# Patient Record
Sex: Male | Born: 1960 | Race: White | Hispanic: No | Marital: Married | Smoking: Former smoker
Health system: Southern US, Community
[De-identification: ages and names within clinical notes are randomized; demographics above are authoritative.]

## PROBLEM LIST (undated history)

## (undated) DIAGNOSIS — E785 Hyperlipidemia, unspecified: Secondary | ICD-10-CM

## (undated) DIAGNOSIS — C61 Malignant neoplasm of prostate: Secondary | ICD-10-CM

## (undated) DIAGNOSIS — E119 Type 2 diabetes mellitus without complications: Secondary | ICD-10-CM

## (undated) DIAGNOSIS — Z8619 Personal history of other infectious and parasitic diseases: Secondary | ICD-10-CM

## (undated) HISTORY — PX: COLONOSCOPY: SHX174

## (undated) HISTORY — DX: Personal history of other infectious and parasitic diseases: Z86.19

## (undated) HISTORY — DX: Malignant neoplasm of prostate: C61

## (undated) HISTORY — DX: Type 2 diabetes mellitus without complications: E11.9

## (undated) HISTORY — DX: Hyperlipidemia, unspecified: E78.5

---

## 2012-06-12 LAB — HM HEPATITIS C SCREENING LAB: HM Hepatitis Screen: NEGATIVE

## 2012-06-24 ENCOUNTER — Ambulatory Visit: Payer: Self-pay | Admitting: Gastroenterology

## 2012-06-24 LAB — HM COLONOSCOPY

## 2013-07-24 ENCOUNTER — Emergency Department: Payer: Self-pay | Admitting: Emergency Medicine

## 2013-07-25 ENCOUNTER — Ambulatory Visit: Payer: Self-pay | Admitting: Family Medicine

## 2013-07-25 IMAGING — CR DG LUMBAR SPINE 2-3V
1 series · 3 of 3 positions shown · non-contrast
Comparison: None.

CLINICAL DATA: Low back pain

EXAM:
LUMBAR SPINE - 2-3 VIEW

[Series 1: ap · 0.17mm/px · 3 of 3 slices shown]
[im 1/3]
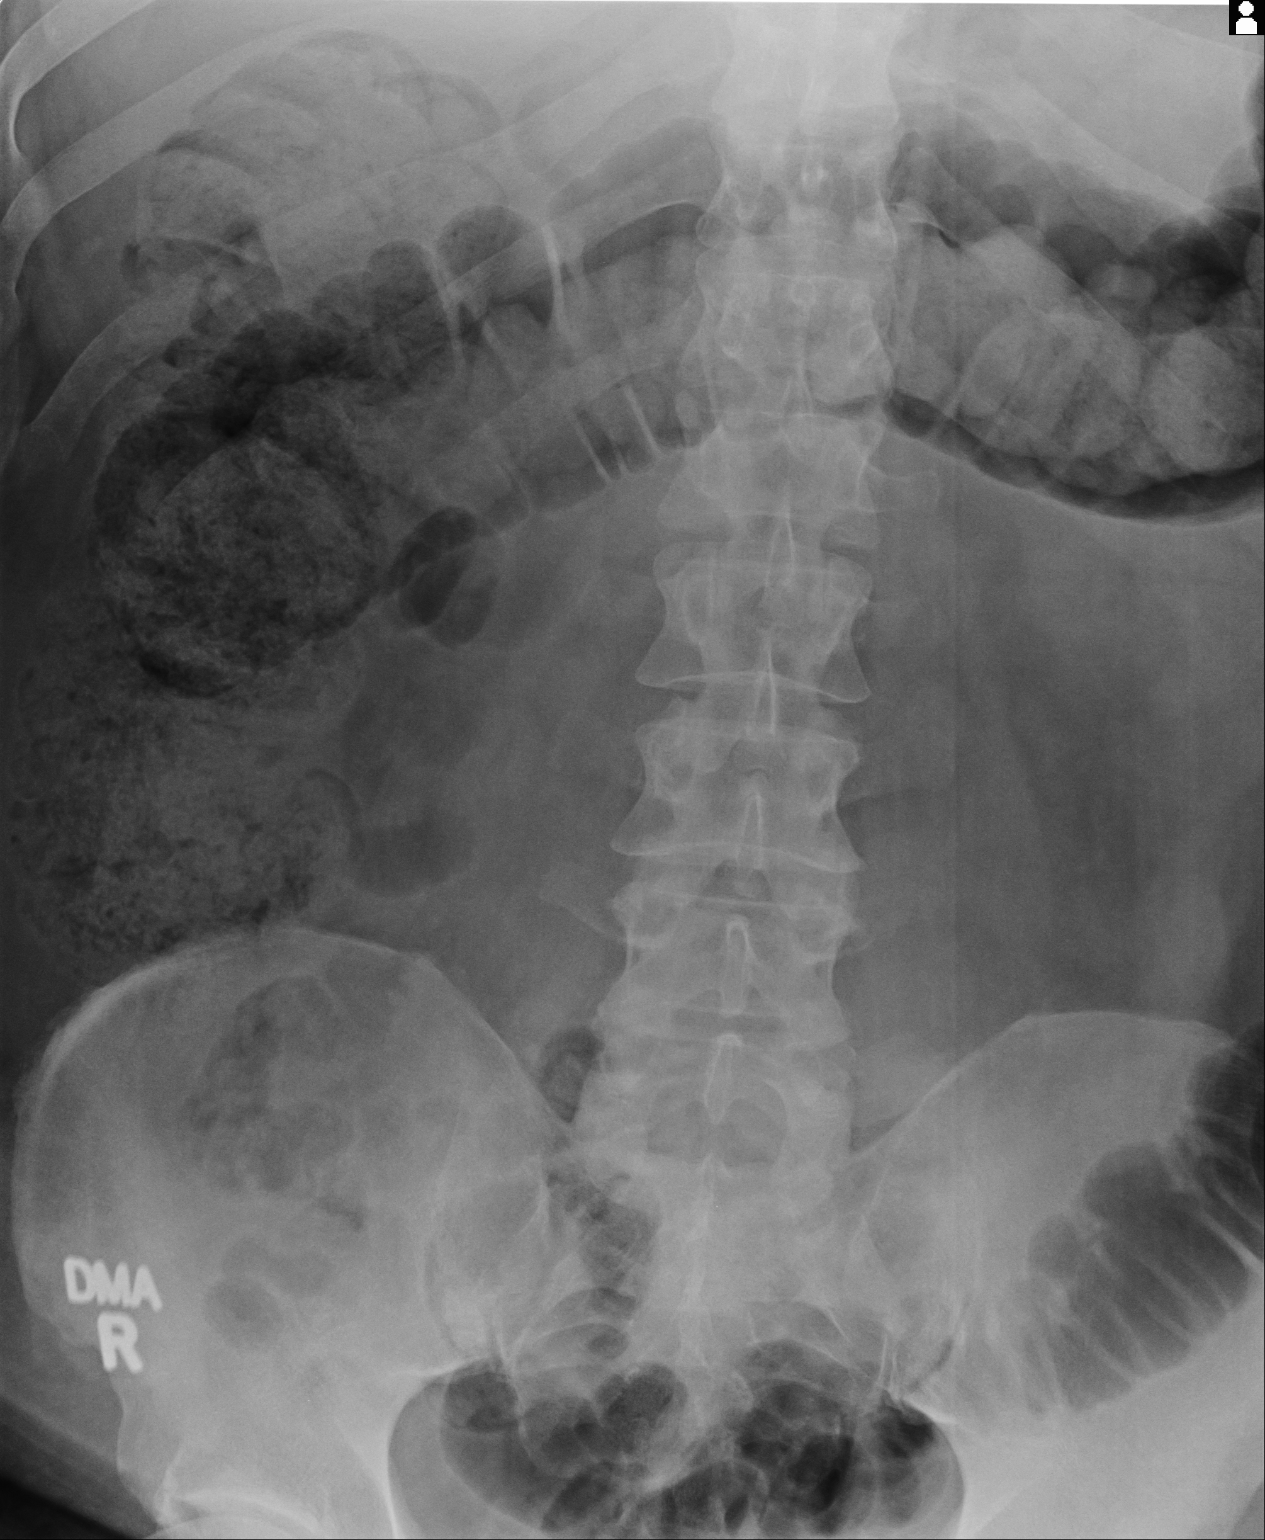
[im 2/3]
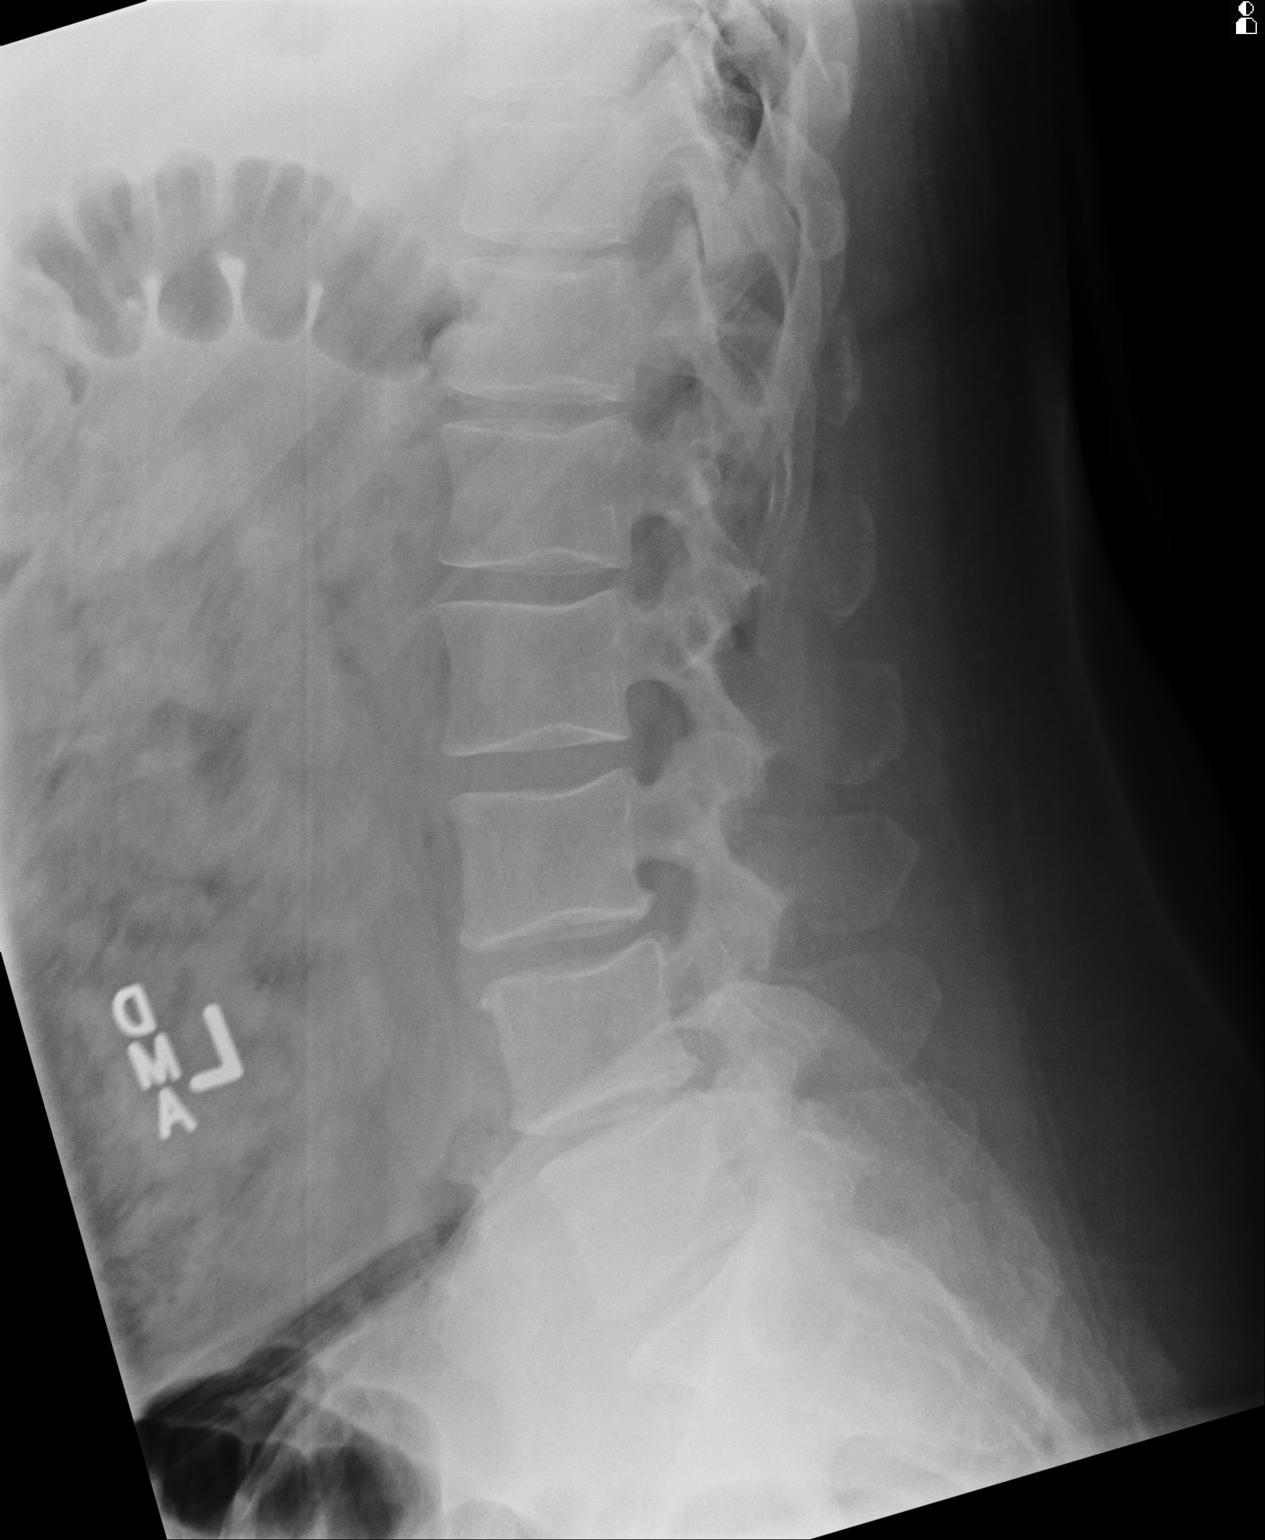
[im 3/3]
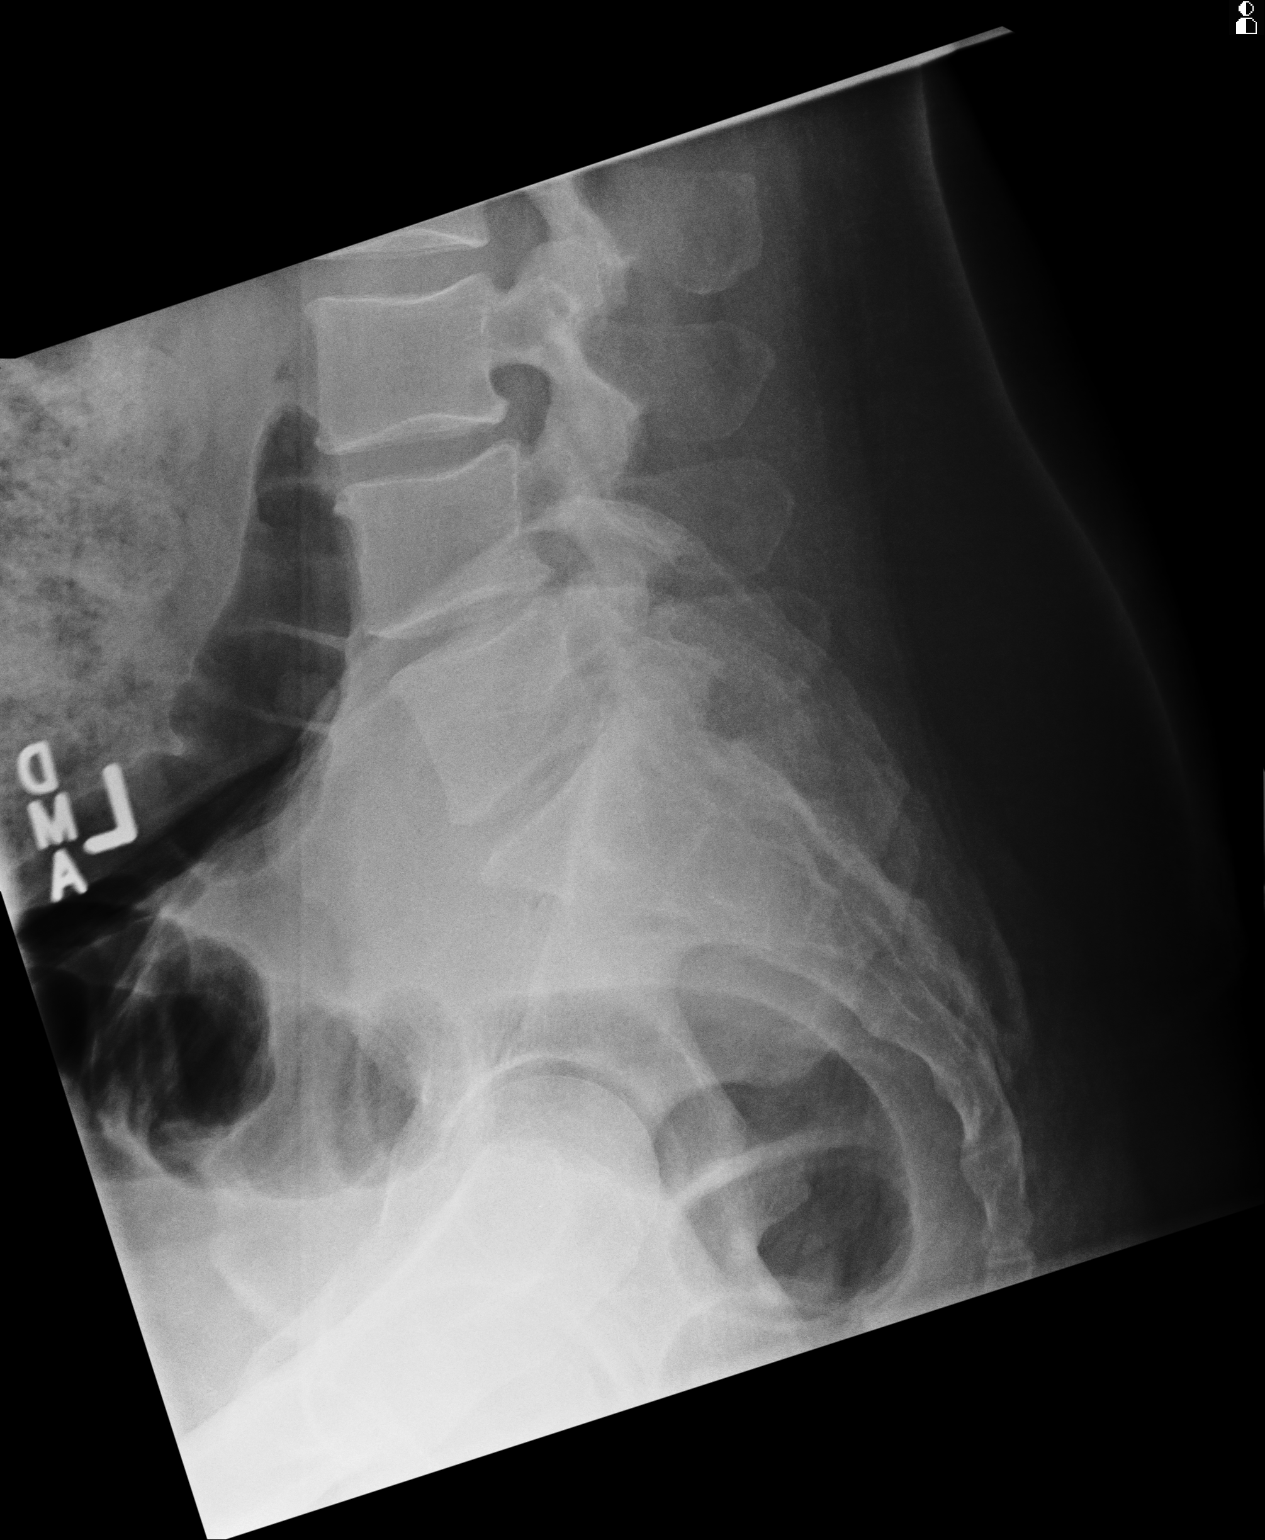

[3 of 3 positions shown; findings below may reference images not displayed]

FINDINGS: Two views study shows no fracture. No subluxation. Loss of disc
height is seen at L4-5 and L5-S1. SI joints are normal.
IMPRESSION: Degenerative disc disease in the lower lumbar spine.

## 2014-07-08 LAB — PSA: PSA: 2.1

## 2014-07-08 LAB — BASIC METABOLIC PANEL
BUN: 16 mg/dL (ref 4–21)
Creatinine: 1.1 mg/dL (ref <=1.3)
Glucose: 335 mg/dL
POTASSIUM: 4.5 mmol/L (ref 3.4–5.3)
SODIUM: 136 mmol/L — AB (ref 137–147)

## 2015-01-08 LAB — HEPATIC FUNCTION PANEL: ALT: 25 U/L (ref 10–40)

## 2015-01-08 LAB — LIPID PANEL
Cholesterol: 150 mg/dL (ref 0–200)
HDL: 44 mg/dL (ref 35–70)
LDL Cholesterol: 85 mg/dL
TRIGLYCERIDES: 103 mg/dL (ref 40–160)

## 2015-01-08 LAB — HEMOGLOBIN A1C: Hgb A1c MFr Bld: 7.4 % — AB (ref 4.0–6.0)

## 2015-03-28 ENCOUNTER — Other Ambulatory Visit: Payer: Self-pay | Admitting: Family Medicine

## 2015-06-07 DIAGNOSIS — M545 Low back pain, unspecified: Secondary | ICD-10-CM | POA: Insufficient documentation

## 2015-06-07 DIAGNOSIS — E785 Hyperlipidemia, unspecified: Secondary | ICD-10-CM | POA: Insufficient documentation

## 2015-06-07 DIAGNOSIS — R809 Proteinuria, unspecified: Secondary | ICD-10-CM | POA: Insufficient documentation

## 2015-06-09 ENCOUNTER — Encounter: Payer: Self-pay | Admitting: Family Medicine

## 2015-06-09 ENCOUNTER — Ambulatory Visit (INDEPENDENT_AMBULATORY_CARE_PROVIDER_SITE_OTHER): Payer: Managed Care, Other (non HMO) | Admitting: Family Medicine

## 2015-06-09 VITALS — BP 106/70 | HR 64 | Temp 98.1°F | Resp 16 | Wt 286.0 lb

## 2015-06-09 DIAGNOSIS — E1121 Type 2 diabetes mellitus with diabetic nephropathy: Secondary | ICD-10-CM | POA: Diagnosis not present

## 2015-06-09 DIAGNOSIS — E669 Obesity, unspecified: Secondary | ICD-10-CM

## 2015-06-09 DIAGNOSIS — E785 Hyperlipidemia, unspecified: Secondary | ICD-10-CM

## 2015-06-09 DIAGNOSIS — E119 Type 2 diabetes mellitus without complications: Secondary | ICD-10-CM | POA: Diagnosis not present

## 2015-06-09 LAB — POCT GLYCOSYLATED HEMOGLOBIN (HGB A1C)
Est. average glucose Bld gHb Est-mCnc: 174
Hemoglobin A1C: 7.7

## 2015-06-09 NOTE — Progress Notes (Signed)
Patient: Ronald Shah Male    DOB: Oct 05, 1960   54 y.o.   MRN: [id number] Visit Date: 06/09/2015  Today's Provider: Lelon Huh, MD   Chief Complaint  Patient presents with  . Diabetes    follow up   Subjective:    HPI  Diabetes Mellitus Type II, Follow-up:   Lab Results  Component Value Date   HGBA1C 7.4* 01/08/2015    Last seen for diabetes 5 months ago.  Management since then includes none. He reports good compliance with treatment. He is not having side effects.  Current symptoms include none and have been stable. Home blood sugar records: fasting range: 120-140  Episodes of hypoglycemia? no   Current Insulin Regimen:  none Most Recent Eye Exam:  >1 year Weight trend: stable Prior visit with dietician: no Current diet: in general, an "unhealthy" diet Current exercise: none  Pertinent Labs:    Component Value Date/Time   CHOL 150 01/08/2015   TRIG 103 01/08/2015   CREATININE 1.1 07/08/2014    Wt Readings from Last 3 Encounters:  06/09/15 286 lb (129.729 kg)  01/08/15 287 lb (130.182 kg)   BMP Latest Ref Rng 07/08/2014  BUN 4 - 21 mg/dL 16  Creatinine .6 - 1.3 mg/dL 1.1  Sodium 137 - 147 mmol/L 136(A)  Potassium 3.4 - 5.3 mmol/L 4.5     ------------------------------------------------------------------------      No Known Allergies Previous Medications   GLIPIZIDE (GLUCOTROL) 5 MG TABLET    Take 1 tablet by mouth 2 (two) times daily.   METFORMIN (GLUMETZA) 500 MG (MOD) 24 HR TABLET    Take 2 tablets by mouth 2 (two) times daily.   PRAVASTATIN (PRAVACHOL) 40 MG TABLET    TAKE 1 TABLET BY MOUTH DAILY   SITAGLIPTIN (JANUVIA) 100 MG TABLET    Take 1 tablet by mouth daily.    Review of Systems  Constitutional: Negative for fever, chills and appetite change.  Respiratory: Negative for chest tightness, shortness of breath and wheezing.   Cardiovascular: Negative for chest pain and palpitations.  Gastrointestinal: Negative for nausea,  vomiting and abdominal pain.    Social History  Substance Use Topics  . Smoking status: Current Some Day Smoker  . Smokeless tobacco: Not on file     Comment: smoked 1 ppd for 20-25 yrs quit in 2005  . Alcohol Use: Yes     Comment: Occasional   Objective:   BP 106/70 mmHg  Pulse 64  Temp(Src) 98.1 F (36.7 C) (Oral)  Resp 16  Wt 286 lb (129.729 kg)  Physical Exam General Appearance:    Alert, cooperative, no distress, obese  Eyes:    PERRL, conjunctiva/corneas clear, EOM's intact       Lungs:     Clear to auscultation bilaterally, respirations unlabored  Heart:    Regular rate and rhythm  Neurologic:   Awake, alert, oriented x 3. No apparent focal neurological           defect.       Diabetic Foot Exam - Simple   No data filed       Results for orders placed or performed in visit on 06/09/15  POCT HgB A1C  Result Value Ref Range   Hemoglobin A1C 7.7    Est. average glucose Bld gHb Est-mCnc 174    v     Assessment & Plan:     1. Type 2 diabetes mellitus with diabetic nephropathy Stable. A1c up a  bit today. Work on diet and exercise.  On no ACEI at this point due to low BP. Monitor creatinine.   2. Diabetes type 2, controlled  - POCT HgB A1C  3. HLD (hyperlipidemia) He is tolerating pravastatin well with no adverse effects.    4. Obesity Diet and exercise      Lelon Huh, MD  [facility]

## 2015-06-09 NOTE — Patient Instructions (Addendum)
   Please contact your eyecare professional to schedule a routine eye exam  Recommend taking 81mg  enteric coated aspirin to reduce risk of vascular events such as heart attacks and strokes.   Increase exercise and reduce carbohydrates in diet  Your goal is to get you HgbA1c<7.5

## 2015-07-06 ENCOUNTER — Encounter: Payer: Self-pay | Admitting: Family Medicine

## 2015-07-06 ENCOUNTER — Ambulatory Visit (INDEPENDENT_AMBULATORY_CARE_PROVIDER_SITE_OTHER): Payer: Managed Care, Other (non HMO) | Admitting: Family Medicine

## 2015-07-06 VITALS — BP 102/64 | HR 70 | Temp 98.5°F | Resp 16 | Ht 72.0 in | Wt 288.0 lb

## 2015-07-06 DIAGNOSIS — Z0000 Encounter for general adult medical examination without abnormal findings: Secondary | ICD-10-CM

## 2015-07-06 DIAGNOSIS — E1121 Type 2 diabetes mellitus with diabetic nephropathy: Secondary | ICD-10-CM

## 2015-07-06 DIAGNOSIS — Z125 Encounter for screening for malignant neoplasm of prostate: Secondary | ICD-10-CM

## 2015-07-06 NOTE — Progress Notes (Signed)
Patient: Ronald Shah, Male    DO: 1961-03-15, 54 y.o.   MRM: [id number] Visit Date: 07/06/2015  Today's Provider: Lelon Huh, MD   Chief Complaint  Patient presents with  . Annual Exam  . Diabetes  . Hyperlipidemia  . Obesity   Subjective:    Annual physical exam Ronald Shah is a 54 y.o. male who presents today for health maintenance and complete physical. He feels well. He reports exercising work. He reports he is sleeping well.  -----------------------------------------------------------------   Follow-up for obesity from 1 month ago; recommended diet and exercise.    Diabetes Mellitus Type II, Follow-up:   Lab Results  Component Value Date   HGBA1C 7.7 06/09/2015   HGBA1C 7.4* 01/08/2015   Last seen for diabetes 1 months ago.  Management since then includes none. He reports good compliance with treatment. He is not having side effects. none Current symptoms include none and have been stable. Home blood sugar records: fasting range: 140  Episodes of hypoglycemia? no   Current Insulin Regimen: n/a Most Recent Eye Exam: due Weight trend: stable Prior visit with dietician: no Current diet: in general, an "unhealthy" diet Current exercise: none  ------------------------------------------------------------------------   Lipid/Cholesterol, Follow-up:   Last seen for this 1 months ago.  Management since that visit includes none.  Last Lipid Panel:    Component Value Date/Time   CHOL 150 01/08/2015   TRIG 103 01/08/2015   HDL 44 01/08/2015   LDLCALC 85 01/08/2015    He reports good compliance with treatment. He is not having side effects. none  Wt Readings from Last 3 Encounters:  07/06/15 288 lb (130.636 kg)  06/09/15 286 lb (129.729 kg)  01/08/15 287 lb (130.182 kg)    ----------------------------------------------------------------------      Review of Systems  Constitutional: Negative.   HENT: Negative.   Eyes:  Negative.   Respiratory: Negative.   Cardiovascular: Negative.   Gastrointestinal: Negative.   Endocrine: Negative.   Genitourinary: Positive for decreased urine volume and difficulty urinating.  Musculoskeletal: Positive for back pain.  Skin: Negative.   Allergic/Immunologic: Negative.   Neurological: Negative.   Hematological: Negative.   Psychiatric/Behavioral: Negative.     Social History He  reports that he has been smoking.  He does not have any smokeless tobacco history on file. He reports that he drinks alcohol. He reports that he does not use illicit drugs. Social History   Social History  . Marital Status: Married    Spouse Name: N/A  . Number of Children: N/A  . Years of Education: N/A   Social History Main Topics  . Smoking status: Current Some Day Smoker  . Smokeless tobacco: None     Comment: smoked 1 ppd for 20-25 yrs quit in 2005  . Alcohol Use: Yes     Comment: Occasional  . Drug Use: No  . Sexual Activity: Not Asked   Other Topics Concern  . None   Social History Narrative    Patient Active Problem List   Diagnosis Date Noted  . Hypercholesteremia 06/07/2015  . HLD (hyperlipidemia) 06/07/2015  . LBP (low back pain) 06/07/2015  . Microalbuminuria 06/07/2015  . Diabetes mellitus with nephropathy ([place]) 07/12/2009  . Family history of cardiovascular disease 06/01/2008  . Obesity 06/01/2008  . Personal history of tobacco use, presenting hazards to health 06/01/2008    History reviewed. No pertinent past surgical history.  Family History  Family Status  Relation Status Death Age  .  Mother Alive   . Father Deceased 85    Died from an MI   His family history includes Diabetes in his mother.    No Known Allergies  Previous Medications   GLIPIZIDE (GLUCOTROL) 5 MG TABLET    Take 1 tablet by mouth 2 (two) times daily.   METFORMIN (GLUMETZA) 500 MG (MOD) 24 HR TABLET    Take 2 tablets by mouth 2 (two) times daily.   PRAVASTATIN (PRAVACHOL) 40  MG TABLET    TAKE 1 TABLET BY MOUTH DAILY   SITAGLIPTIN (JANUVIA) 100 MG TABLET    Take 1 tablet by mouth daily.    Patient Care Team: Birdie Sons, MD as PCP - General (Family Medicine)     Objective:   Vitals: BP 102/64 mmHg  Pulse 70  Temp(Src) 98.5 F (36.9 C) (Oral)  Resp 16  Ht 6' (1.829 m)  Wt 288 lb (130.636 kg)  BMI 39.05 kg/m2  SpO2 97%   Physical Exam   General Appearance:    Alert, cooperative, no distress, appears stated age, morbidly obese  Head:    Normocephalic, without obvious abnormality, atraumatic  Eyes:    PERRL, conjunctiva/corneas clear, EOM's intact, fundi    benign, both eyes       Ears:    Normal TM's and external ear canals, both ears  Nose:   Nares normal, septum midline, mucosa normal, no drainage   or sinus tenderness  Throat:   Lips, mucosa, and tongue normal; teeth and gums normal  Neck:   Supple, symmetrical, trachea midline, no adenopathy;       thyroid:  No enlargement/tenderness/nodules; no carotid   bruit or JVD  Back:     Symmetric, no curvature, ROM normal, no CVA tenderness  Lungs:     Clear to auscultation bilaterally, respirations unlabored  Chest wall:    No tenderness or deformity  Heart:    Regular rate and rhythm, S1 and S2 normal, no murmur, rub   or gallop  Abdomen:     Soft, non-tender, bowel sounds active all four quadrants,    no masses, no organomegaly  Genitalia:    deferred  Rectal:    deferred  Extremities:   Extremities normal, atraumatic, no cyanosis or edema  Pulses:   2+ and symmetric all extremities  Skin:   Skin color, texture, turgor normal, no rashes or lesions  Lymph nodes:   Cervical, supraclavicular, and axillary nodes normal  Neurologic:   CNII-XII intact. Normal strength, sensation and reflexes      throughout     Depression Screen PHQ 2/9 Scores 06/09/2015  PHQ - 2 Score 0  PHQ- 9 Score 0      Assessment & Plan:     Routine Health Maintenance and Physical Exam  Exercise Activities  and Dietary recommendations Goals    None      Immunization History  Administered Date(s) Administered  . Hepatitis B 06/11/2012, 07/12/2012, 12/16/2012  . Pneumococcal Polysaccharide-23 03/08/2012  . Tdap 06/01/2008    Health Maintenance  Topic Date Due  . OPHTHALMOLOGY EXAM  05/10/1970  . URINE MICROALBUMIN  05/10/1970  . HIV Screening  05/11/1975  . INFLUENZA VACCINE  12/18/2015 (Originally 04/19/2015)  . HEMOGLOBIN A1C  12/07/2015  . FOOT EXAM  06/08/2016  . PNEUMOCOCCAL POLYSACCHARIDE VACCINE (2) 03/08/2017  . TETANUS/TDAP  06/01/2018  . COLONOSCOPY  06/24/2022  . Hepatitis C Screening  Completed      Discussed health benefits of physical activity, and encouraged  him to engage in regular exercise appropriate for his age and condition.    --------------------------------------------------------------------  1. Annual physical exam [place] doing well.   2. Diabetes mellitus with nephropathy ([place]) Controlled. Continue 2 months for routine follow up.  - Renal function panel - EKG 12-Lead  3. Prostate cancer screening  - PSA  4. Morbid Obesity - Counseled regarding diet and exercise.

## 2015-07-06 NOTE — Patient Instructions (Signed)
   Recommend taking 81mg  enteric coated aspirin to reduce risk of vascular events such as heart attacks and strokes.    Please contact your eyecare professional to schedule a routine eye exam   I recommend that you get a flu vaccine this year. Please call our office at [phone] at your earliest convenience to schedule a flu shot.

## 2015-07-07 LAB — RENAL FUNCTION PANEL
Albumin: 3.9 g/dL (ref 3.5–5.5)
BUN/Creatinine Ratio: 17 (ref 9–20)
BUN: 17 mg/dL (ref 6–24)
CALCIUM: 9.5 mg/dL (ref 8.7–10.2)
CHLORIDE: 100 mmol/L (ref 97–106)
CO2: 24 mmol/L (ref 18–29)
Creatinine, Ser: 1.03 mg/dL (ref 0.76–1.27)
GFR calc Af Amer: 94 mL/min/{1.73_m2} (ref >59)
GFR calc non Af Amer: 81 mL/min/{1.73_m2} (ref >59)
GLUCOSE: 192 mg/dL — AB (ref 65–99)
PHOSPHORUS: 2.4 mg/dL — AB (ref 2.5–4.5)
POTASSIUM: 4.4 mmol/L (ref 3.5–5.2)
SODIUM: 137 mmol/L (ref 136–144)

## 2015-07-07 LAB — PSA: PROSTATE SPECIFIC AG, SERUM: 3 ng/mL (ref 0.0–4.0)

## 2015-10-07 ENCOUNTER — Ambulatory Visit: Payer: Managed Care, Other (non HMO) | Admitting: Family Medicine

## 2015-10-18 ENCOUNTER — Other Ambulatory Visit: Payer: Self-pay | Admitting: Family Medicine

## 2015-10-18 ENCOUNTER — Encounter: Payer: Self-pay | Admitting: Family Medicine

## 2015-10-18 ENCOUNTER — Ambulatory Visit (INDEPENDENT_AMBULATORY_CARE_PROVIDER_SITE_OTHER): Payer: Managed Care, Other (non HMO) | Admitting: Family Medicine

## 2015-10-18 VITALS — BP 100/74 | HR 63 | Temp 98.3°F | Resp 16 | Ht 72.0 in | Wt 287.0 lb

## 2015-10-18 DIAGNOSIS — E1121 Type 2 diabetes mellitus with diabetic nephropathy: Secondary | ICD-10-CM | POA: Diagnosis not present

## 2015-10-18 LAB — POCT GLYCOSYLATED HEMOGLOBIN (HGB A1C)
Est. average glucose Bld gHb Est-mCnc: 180
HEMOGLOBIN A1C: 7.9

## 2015-10-18 NOTE — Patient Instructions (Signed)
   Please contact your eyecare professional to schedule a routine eye exam  

## 2015-10-18 NOTE — Progress Notes (Signed)
Patient: Ronald Shah Male    DOB: 1961-07-16   55 y.o.   MRN: [id number] Visit Date: 10/18/2015  Today's Provider: Lelon Huh, MD   Chief Complaint  Patient presents with  . Follow-up  . Diabetes  . Hyperlipidemia  . Obesity   Subjective:    HPI  Follow-up for morbid obesity from 06/09/2015; advised diet and exercise.     Diabetes Mellitus Type II, Follow-up:   Lab Results  Component Value Date   HGBA1C 7.7 06/09/2015   HGBA1C 7.4* 01/08/2015   Last seen for diabetes 3 months ago.  Management since then includes; no changes. He reports good compliance with treatment. He is not having side effects. none Current symptoms include none and have been unchanged. Home blood sugar records: fasting range: 119-140  Episodes of hypoglycemia? no   Current Insulin Regimen: n/a Most Recent Eye Exam: due Weight trend: stable Prior visit with dietician: no Current diet: in general, an "unhealthy" diet Current exercise: none  ----------------------------------------------------------------------   Lipid/Cholesterol, Follow-up:   Last seen for this 4 months ago.  Management since that visit includes; no changes.  Last Lipid Panel:    Component Value Date/Time   CHOL 150 01/08/2015   TRIG 103 01/08/2015   HDL 44 01/08/2015   LDLCALC 85 01/08/2015    He reports good compliance with treatment. He is not having side effects. none  Wt Readings from Last 3 Encounters:  10/18/15 287 lb (130.182 kg)  07/06/15 288 lb (130.636 kg)  06/09/15 286 lb (129.729 kg)    ----------------------------------------------------------------------     No Known Allergies Previous Medications   GLIPIZIDE (GLUCOTROL) 5 MG TABLET    Take 1 tablet by mouth 2 (two) times daily.   METFORMIN (GLUMETZA) 500 MG (MOD) 24 HR TABLET    Take 2 tablets by mouth 2 (two) times daily.   PRAVASTATIN (PRAVACHOL) 40 MG TABLET    TAKE 1 TABLET BY MOUTH DAILY   SITAGLIPTIN (JANUVIA) 100  MG TABLET    Take 1 tablet by mouth daily.    Review of Systems  Constitutional: Negative for fever, chills and appetite change.  Respiratory: Negative for chest tightness, shortness of breath and wheezing.   Cardiovascular: Negative for chest pain and palpitations.  Gastrointestinal: Negative for nausea, vomiting and abdominal pain.  Musculoskeletal: Positive for back pain.    Social History  Substance Use Topics  . Smoking status: Current Some Day Smoker  . Smokeless tobacco: Not on file     Comment: smoked 1 ppd for 20-25 yrs quit in 2005  . Alcohol Use: Yes     Comment: Occasional   Objective:   BP 100/74 mmHg  Pulse 63  Temp(Src) 98.3 F (36.8 C) (Oral)  Resp 16  Ht 6' (1.829 m)  Wt 287 lb (130.182 kg)  BMI 38.92 kg/m2  SpO2 97%  Physical Exam  General appearance: alert, well developed, well nourished, cooperative and in no distress Head: Normocephalic, without obvious abnormality, atraumatic Lungs: Respirations even and unlabored Extremities: No gross deformities Skin: Skin color, texture, turgor normal. No rashes seen  Psych: Appropriate mood and affect. Neurologic: Mental status: Alert, oriented to person, place, and time, thought content appropriate.  Results for orders placed or performed in visit on 10/18/15  POCT glycosylated hemoglobin (Hb A1C)  Result Value Ref Range   Hemoglobin A1C 7.9    Est. average glucose Bld gHb Est-mCnc 180        Assessment & Plan:  1. Diabetes mellitus with nephropathy (HCC) A1c slowly rising He is going to try to start going to the gym with his wife a few times a week. His job in [employer] is fairly physically demanding. If A1c gets above 8 will consider increasing glipizide - POCT glycosylated hemoglobin (Hb A1C)  2. Morbid obesity, unspecified obesity type ([place]) Work on improving diet and increasing exercise  Return in about 4 months (around 02/15/2016).        Lelon Huh, MD  [facility]

## 2015-10-22 ENCOUNTER — Other Ambulatory Visit: Payer: Self-pay | Admitting: Family Medicine

## 2016-02-15 ENCOUNTER — Encounter: Payer: Self-pay | Admitting: Family Medicine

## 2016-02-15 ENCOUNTER — Ambulatory Visit (INDEPENDENT_AMBULATORY_CARE_PROVIDER_SITE_OTHER): Payer: Managed Care, Other (non HMO) | Admitting: Family Medicine

## 2016-02-15 VITALS — BP 104/70 | HR 63 | Temp 98.5°F | Resp 16 | Ht 72.0 in | Wt 283.0 lb

## 2016-02-15 DIAGNOSIS — E1121 Type 2 diabetes mellitus with diabetic nephropathy: Secondary | ICD-10-CM | POA: Diagnosis not present

## 2016-02-15 DIAGNOSIS — E785 Hyperlipidemia, unspecified: Secondary | ICD-10-CM | POA: Diagnosis not present

## 2016-02-15 LAB — POCT UA - MICROALBUMIN: MICROALBUMIN (UR) POC: 50 mg/L

## 2016-02-15 LAB — POCT GLYCOSYLATED HEMOGLOBIN (HGB A1C)
ESTIMATED AVERAGE GLUCOSE: 177
HEMOGLOBIN A1C: 7.8

## 2016-02-15 NOTE — Patient Instructions (Signed)
It is recommended to engage in 150 minutes of moderate exercise every week.   

## 2016-02-15 NOTE — Progress Notes (Signed)
Patient: Ronald Shah Male    DOB: 1961-08-11   55 y.o.   MRN: [id number] Visit Date: 02/15/2016  Today's Provider: Lelon Huh, MD   Chief Complaint  Patient presents with  . Follow-up  . Diabetes  . Obesity   Subjective:    HPI    Diabetes Mellitus Type II, Follow-up:   Lab Results  Component Value Date   HGBA1C 7.9 10/18/2015   HGBA1C 7.7 06/09/2015   HGBA1C 7.4* 01/08/2015   Last seen for diabetes 4 months ago.  Management since then includes; patient will start going to the gym with his wife a few times a week, if A1c gets above 8 will consider increasing glipizide. He reports good compliance with treatment. He is not having side effects. none Current symptoms include none and have been unchanged. Home blood sugar records: fasting range: 141  Episodes of hypoglycemia? no   Current Insulin Regimen: n/a Most Recent Eye Exam: due Weight trend: stable Prior visit with dietician: no Current diet: well balanced Current exercise: working  [facility] Readings from Last 3 Encounters:  02/15/16 283 lb (128.368 kg)  10/18/15 287 lb (130.182 kg)  07/06/15 288 lb (130.636 kg)   BP Readings from Last 3 Encounters:  02/15/16 104/70  10/18/15 100/74  07/06/15 102/64    ----------------------------------------------------------------------      Lipid/Cholesterol, Follow-up:   Last seen for this 4 months ago.  Management since that visit includes; no changes.  Last Lipid Panel:    Component Value Date/Time   CHOL 150 01/08/2015   TRIG 103 01/08/2015   HDL 44 01/08/2015   LDLCALC 85 01/08/2015    He reports good compliance with treatment. He is not having side effects. none  Wt Readings from Last 3 Encounters:  02/15/16 283 lb (128.368 kg)  10/18/15 287 lb (130.182 kg)  07/06/15 288 lb (130.636 kg)    ----------------------------------------------------------------------      No Known Allergies Previous Medications   GLIPIZIDE  (GLUCOTROL) 5 MG TABLET    TAKE 1 TABLET BY MOUTH TWICE DAILY   JANUVIA 100 MG TABLET    TAKE ONE TABLET BY MOUTH DAILY   METFORMIN (GLUCOPHAGE-XR) 500 MG 24 HR TABLET    TAKE 2 TABLETS BY MOUTH TWICE DAILY   PRAVASTATIN (PRAVACHOL) 40 MG TABLET    TAKE 1 TABLET BY MOUTH DAILY    Review of Systems  Constitutional: Negative for fever, chills and appetite change.  Respiratory: Negative for chest tightness, shortness of breath and wheezing.   Cardiovascular: Negative for chest pain and palpitations.  Gastrointestinal: Negative for nausea, vomiting and abdominal pain.    Social History  Substance Use Topics  . Smoking status: Current Some Day Smoker  . Smokeless tobacco: Not on file     Comment: smoked 1 ppd for 20-25 yrs quit in 2005  . Alcohol Use: Yes     Comment: Occasional   Objective:   BP 104/70 mmHg  Pulse 63  Temp(Src) 98.5 F (36.9 C) (Oral)  Resp 16  Ht 6' (1.829 m)  Wt 283 lb (128.368 kg)  BMI 38.37 kg/m2  SpO2 97%  Physical Exam   General Appearance:    Alert, cooperative, no distress  Eyes:    PERRL, conjunctiva/corneas clear, EOM's intact       Lungs:     Clear to auscultation bilaterally, respirations unlabored  Heart:    Regular rate and rhythm  Neurologic:   Awake, alert, oriented x 3. No apparent  focal neurological           defect.        Results for orders placed or performed in visit on 02/15/16  POCT glycosylated hemoglobin (Hb A1C)  Result Value Ref Range   Hemoglobin A1C 7.8    Est. average glucose Bld gHb Est-mCnc 177   POCT UA - Microalbumin  Result Value Ref Range   Microalbumin Ur, POC 50 mg/L   Creatinine, POC n/a mg/dL   Albumin/Creatinine Ratio, Urine, POC n/a        Assessment & Plan:     1. Diabetes mellitus with nephropathy (HCC) BP remains low. Encouraged to drink more water. Hold off of ACEI/ARM for now due to low BP. Consider losartan if GFR declines.   - POCT glycosylated hemoglobin (Hb A1C) - POCT UA - Microalbumin -  Renal function panel  2. HLD (hyperlipidemia) He is tolerating pravastatin well with no adverse effects.   - Lipid panel - Hepatic function panel  Follow up 6 months.       Lelon Huh, MD  [facility]

## 2016-02-16 LAB — LIPID PANEL
CHOL/HDL RATIO: 3.3 ratio (ref 0.0–5.0)
CHOLESTEROL TOTAL: 147 mg/dL (ref 100–199)
HDL: 44 mg/dL (ref >39)
LDL CALC: 82 mg/dL (ref 0–99)
Triglycerides: 106 mg/dL (ref 0–149)
VLDL CHOLESTEROL CAL: 21 mg/dL (ref 5–40)

## 2016-02-16 LAB — RENAL FUNCTION PANEL
Albumin: 4.1 g/dL (ref 3.5–5.5)
BUN / CREAT RATIO: 20 (ref 9–20)
BUN: 21 mg/dL (ref 6–24)
CALCIUM: 9.3 mg/dL (ref 8.7–10.2)
CHLORIDE: 103 mmol/L (ref 96–106)
CO2: 22 mmol/L (ref 18–29)
Creatinine, Ser: 1.04 mg/dL (ref 0.76–1.27)
GFR calc non Af Amer: 80 mL/min/{1.73_m2} (ref >59)
GFR, EST AFRICAN AMERICAN: 93 mL/min/{1.73_m2} (ref >59)
Glucose: 129 mg/dL — ABNORMAL HIGH (ref 65–99)
Phosphorus: 3.4 mg/dL (ref 2.5–4.5)
Potassium: 4.7 mmol/L (ref 3.5–5.2)
SODIUM: 143 mmol/L (ref 134–144)

## 2016-02-16 LAB — HEPATIC FUNCTION PANEL
ALT: 29 IU/L (ref 0–44)
AST: 20 IU/L (ref 0–40)
Alkaline Phosphatase: 59 IU/L (ref 39–117)
Bilirubin Total: 0.4 mg/dL (ref 0.0–1.2)
Bilirubin, Direct: 0.16 mg/dL (ref 0.00–0.40)
TOTAL PROTEIN: 6.7 g/dL (ref 6.0–8.5)

## 2016-06-05 ENCOUNTER — Other Ambulatory Visit: Payer: Self-pay | Admitting: Family Medicine

## 2016-06-26 ENCOUNTER — Telehealth: Payer: Self-pay | Admitting: Family Medicine

## 2016-06-26 NOTE — Telephone Encounter (Signed)
Pt needs a physical appt  Preferably in the AM before the end of October.  Please advise  Call back is [phone]  Thanks teri

## 2016-06-26 NOTE — Telephone Encounter (Signed)
There is an opening at 10:00 am on the 31st of oct.

## 2016-07-07 ENCOUNTER — Other Ambulatory Visit: Payer: Self-pay | Admitting: Family Medicine

## 2016-07-18 ENCOUNTER — Encounter: Payer: Self-pay | Admitting: Family Medicine

## 2016-07-18 ENCOUNTER — Ambulatory Visit (INDEPENDENT_AMBULATORY_CARE_PROVIDER_SITE_OTHER): Payer: Managed Care, Other (non HMO) | Admitting: Family Medicine

## 2016-07-18 VITALS — BP 118/74 | HR 64 | Temp 98.1°F | Resp 16 | Ht 72.0 in | Wt 273.0 lb

## 2016-07-18 DIAGNOSIS — Z0000 Encounter for general adult medical examination without abnormal findings: Secondary | ICD-10-CM | POA: Diagnosis not present

## 2016-07-18 DIAGNOSIS — E1121 Type 2 diabetes mellitus with diabetic nephropathy: Secondary | ICD-10-CM

## 2016-07-18 NOTE — Progress Notes (Signed)
Patient: Ronald Shah, Male    DOB: 1961-04-12, 55 y.o.   MRN: [id number] Visit Date: 07/18/2016  Today's Provider: Lelon Huh, MD   Chief Complaint  Patient presents with  . Annual Exam   Subjective:    Annual physical exam Ronald Shah is a 55 y.o. male who presents today for health maintenance and complete physical. He feels well. He reports exercising occasionally. He reports he is sleeping well.     Diabetes Mellitus Type II, Follow-up:   Lab Results  Component Value Date   HGBA1C 7.8 02/15/2016   HGBA1C 7.9 10/18/2015   HGBA1C 7.7 06/09/2015    Last seen for diabetes 5 months ago.  Management since then includes no changes. It was recommended that we would hold off on ACE/ARB due to hypotension.  He reports good compliance with treatment. He is not having side effects.  Current symptoms include none and have been stable. Home blood sugar records: 120s  Episodes of hypoglycemia? no   Current Insulin Regimen: none Most Recent Eye Exam: pt unsure.  Weight trend: stable  Prior visit with dietician: no Current diet: well balanced Current exercise: walking  Pertinent Labs:    Component Value Date/Time   CHOL 147 02/15/2016 0855   TRIG 106 02/15/2016 0855   HDL 44 02/15/2016 0855   LDLCALC 82 02/15/2016 0855   CREATININE 1.04 02/15/2016 0855    Wt Readings from Last 3 Encounters:  07/18/16 273 lb (123.8 kg)  02/15/16 283 lb (128.4 kg)  10/18/15 287 lb (130.2 kg)    ------------------------------------------------------------------------   Lipid/Cholesterol, Follow-up:   Last seen for this5 months ago.  Management changes since that visit include no changes. . Last Lipid Panel:    Component Value Date/Time   CHOL 147 02/15/2016 0855   TRIG 106 02/15/2016 0855   HDL 44 02/15/2016 0855   CHOLHDL 3.3 02/15/2016 0855   LDLCALC 82 02/15/2016 0855    Risk factors for vascular disease include diabetes mellitus  He reports good  compliance with treatment. He is not having side effects.  Current symptoms include none and have been stable. Weight trend: stable Prior visit with dietician: no Current diet: well balanced Current exercise: walking  Wt Readings from Last 3 Encounters:  07/18/16 273 lb (123.8 kg)  02/15/16 283 lb (128.4 kg)  10/18/15 287 lb (130.2 kg)     Review of Systems  Constitutional: Negative.   HENT: Negative.   Eyes: Negative.   Respiratory: Negative.   Cardiovascular: Negative.   Gastrointestinal: Negative.   Endocrine: Negative.   Genitourinary: Negative.   Musculoskeletal: Negative.   Skin: Negative.   Allergic/Immunologic: Negative.   Neurological: Negative.   Hematological: Negative.   Psychiatric/Behavioral: Negative.     Social History      He  reports that he has been smoking.  He does not have any smokeless tobacco history on file. He reports that he drinks alcohol. He reports that he does not use drugs.       Social History   Social History  . Marital status: Married    Spouse name: N/A  . Number of children: N/A  . Years of education: N/A   Social History Main Topics  . Smoking status: Current Some Day Smoker  . Smokeless tobacco: None     Comment: smoked 1 ppd for 20-25 yrs quit in 2005  . Alcohol use Yes     Comment: Occasional  . Drug use: No  .  Sexual activity: Not Asked   Other Topics Concern  . None   Social History Narrative  . None    Past Medical History:  Diagnosis Date  . History of chicken pox      Patient Active Problem List   Diagnosis Date Noted  . HLD (hyperlipidemia) 06/07/2015  . LBP (low back pain) 06/07/2015  . Microalbuminuria 06/07/2015  . Diabetes mellitus with nephropathy ([place]) 07/12/2009  . Family history of cardiovascular disease 06/01/2008  . Morbid obesity ([place]) 06/01/2008  . Personal history of tobacco use, presenting hazards to health 06/01/2008    No past surgical history on file.  Family History          Family Status  Relation Status  . Mother Alive  . Father Deceased at age 34   Died from an MI        His family history includes Diabetes in his mother.    No Known Allergies  Current Meds  Medication Sig  . glipiZIDE (GLUCOTROL) 5 MG tablet TAKE 1 TABLET BY MOUTH TWICE DAILY  . JANUVIA 100 MG tablet TAKE 1 TABLET BY MOUTH DAILY  . metFORMIN (GLUCOPHAGE-XR) 500 MG 24 hr tablet TAKE 2 TABLETS BY MOUTH TWICE DAILY  . pravastatin (PRAVACHOL) 40 MG tablet TAKE 1 TABLET BY MOUTH DAILY    Patient Care Team: Birdie Sons, MD as PCP - General (Family Medicine)     Objective:   Vitals: BP 118/74 (BP Location: Left Arm, Patient Position: Sitting, Cuff Size: Large)   Pulse 64   Temp 98.1 F (36.7 C)   Resp 16   Ht 6' (1.829 m)   Wt 273 lb (123.8 kg)   BMI 37.03 kg/m    Physical Exam  General Appearance:    Alert, cooperative, no distress, appears stated age, obese  Head:    Normocephalic, without obvious abnormality, atraumatic  Eyes:    PERRL, conjunctiva/corneas clear, EOM's intact, fundi    benign, both eyes       Ears:    Normal TM's and external ear canals, both ears  Nose:   Nares normal, septum midline, mucosa normal, no drainage   or sinus tenderness  Throat:   Lips, mucosa, and tongue normal; teeth and gums normal  Neck:   Supple, symmetrical, trachea midline, no adenopathy;       thyroid:  No enlargement/tenderness/nodules; no carotid   bruit or JVD  Back:     Symmetric, no curvature, ROM normal, no CVA tenderness  Lungs:     Clear to auscultation bilaterally, respirations unlabored  Chest wall:    No tenderness or deformity  Heart:    Regular rate and rhythm, S1 and S2 normal, no murmur, rub   or gallop  Abdomen:     Soft, non-tender, bowel sounds active all four quadrants,    no masses, no organomegaly  Genitalia:    deferred  Rectal:    deferred  Extremities:   Extremities normal, atraumatic, no cyanosis or edema  Pulses:   2+ and symmetric all  extremities  Skin:   Skin color, texture, turgor normal, no rashes or lesions  Lymph nodes:   Cervical, supraclavicular, and axillary nodes normal  Neurologic:   CNII-XII intact. Normal strength, sensation and reflexes      throughout     Depression Screen PHQ 2/9 Scores 06/09/2015  PHQ - 2 Score 0  PHQ- 9 Score 0      Assessment & Plan:     Routine Health  Maintenance and Physical Exam  Exercise Activities and Dietary recommendations Goals    None      Immunization History  Administered Date(s) Administered  . Hepatitis B 06/11/2012, 07/12/2012, 12/16/2012  . Pneumococcal Polysaccharide-23 03/08/2012  . Tdap 06/01/2008    Health Maintenance  Topic Date Due  . OPHTHALMOLOGY EXAM  05/10/1970  . HIV Screening  05/11/1975  . INFLUENZA VACCINE  04/18/2016  . FOOT EXAM  06/08/2016  . HEMOGLOBIN A1C  08/17/2016  . URINE MICROALBUMIN  02/14/2017  . PNEUMOCOCCAL POLYSACCHARIDE VACCINE (2) 03/08/2017  . TETANUS/TDAP  06/01/2018  . COLONOSCOPY  06/24/2022  . Hepatitis C Screening  Completed      Discussed health benefits of physical activity, and encouraged him to engage in regular exercise appropriate for his age and condition.    --------------------------------------------------------------------  1. Annual physical exam Doing well. Refused flu vaccine - PSA - Comprehensive metabolic panel  2. Diabetes mellitus with nephropathy ([place]) Off ARB due to hypotension. See how ranal panel looks.  - Hemoglobin A1c   Lelon Huh, MD  [facility]

## 2016-07-19 LAB — COMPREHENSIVE METABOLIC PANEL
ALK PHOS: 60 IU/L (ref 39–117)
ALT: 26 IU/L (ref 0–44)
AST: 15 IU/L (ref 0–40)
Albumin/Globulin Ratio: 1.4 (ref 1.2–2.2)
Albumin: 4 g/dL (ref 3.5–5.5)
BILIRUBIN TOTAL: 0.5 mg/dL (ref 0.0–1.2)
BUN/Creatinine Ratio: 16 (ref 9–20)
BUN: 18 mg/dL (ref 6–24)
CHLORIDE: 100 mmol/L (ref 96–106)
CO2: 25 mmol/L (ref 18–29)
CREATININE: 1.1 mg/dL (ref 0.76–1.27)
Calcium: 9.7 mg/dL (ref 8.7–10.2)
GFR calc Af Amer: 86 mL/min/{1.73_m2} (ref >59)
GFR calc non Af Amer: 75 mL/min/{1.73_m2} (ref >59)
GLOBULIN, TOTAL: 2.9 g/dL (ref 1.5–4.5)
GLUCOSE: 125 mg/dL — AB (ref 65–99)
Potassium: 4.5 mmol/L (ref 3.5–5.2)
SODIUM: 140 mmol/L (ref 134–144)
Total Protein: 6.9 g/dL (ref 6.0–8.5)

## 2016-07-19 LAB — PSA: Prostate Specific Ag, Serum: 3.9 ng/mL (ref 0.0–4.0)

## 2016-07-19 LAB — HEMOGLOBIN A1C
ESTIMATED AVERAGE GLUCOSE: 169 mg/dL
HEMOGLOBIN A1C: 7.5 % — AB (ref 4.8–5.6)

## 2016-08-17 ENCOUNTER — Ambulatory Visit: Payer: Managed Care, Other (non HMO) | Admitting: Family Medicine

## 2016-09-21 ENCOUNTER — Telehealth: Payer: Self-pay | Admitting: Family Medicine

## 2016-09-21 DIAGNOSIS — R972 Elevated prostate specific antigen [PSA]: Secondary | ICD-10-CM | POA: Insufficient documentation

## 2016-09-21 NOTE — Telephone Encounter (Signed)
Please advise patient it is time to recheck PSA since it had gone up when we checked it in November. Have printed lab for him to pick up at front desk. Does not need to be fasting.

## 2016-09-21 NOTE — Telephone Encounter (Signed)
Left message informing pt. Emily Drozdowski, CMA  

## 2016-09-23 LAB — PSA TOTAL (REFLEX TO FREE): PROSTATE SPECIFIC AG, SERUM: 3.9 ng/mL (ref 0.0–4.0)

## 2016-11-20 ENCOUNTER — Other Ambulatory Visit: Payer: Self-pay | Admitting: Family Medicine

## 2016-12-21 ENCOUNTER — Other Ambulatory Visit: Payer: Self-pay | Admitting: Family Medicine

## 2016-12-21 NOTE — Telephone Encounter (Signed)
Last office visit 07/18/2016. Last refill 06/05/2016 qty 30 with 6 refills

## 2017-01-22 ENCOUNTER — Other Ambulatory Visit: Payer: Self-pay | Admitting: Family Medicine

## 2017-01-22 MED ORDER — SITAGLIPTIN PHOSPHATE 100 MG PO TABS: 100.0000 mg | ORAL_TABLET | Freq: Every day | ORAL | 6 refills | Status: DC

## 2017-01-22 NOTE — Telephone Encounter (Signed)
[facility] faxed a request for the following medication. Thanks CC  JANUVIA 100 MG tablet  Take one tablet every day.

## 2017-03-07 ENCOUNTER — Other Ambulatory Visit: Payer: Self-pay | Admitting: Family Medicine

## 2017-07-08 ENCOUNTER — Other Ambulatory Visit: Payer: Self-pay | Admitting: Family Medicine

## 2017-07-09 ENCOUNTER — Telehealth: Payer: Self-pay | Admitting: Family Medicine

## 2017-07-09 ENCOUNTER — Encounter: Payer: Managed Care, Other (non HMO) | Admitting: Family Medicine

## 2017-07-09 NOTE — Telephone Encounter (Signed)
Scheduled pt with Simona Huh 07/19/17 at 10:00 am for CPE. Patient notified.

## 2017-07-09 NOTE — Telephone Encounter (Signed)
Pt was scheduled for CPE on 07/09/17 but had CPE last year on 07/18/17. Pt stated that someone left a message for him to reschedule b/c his insurance wouldn't cover early CPE. Pt stated that he needs a CPE on 07/19/17 for him to keep a lower cost of his insurance. Pt is asking if he can be worked in for 07/19/17 or if he can see a PA that day. Please advise. Thanks TNP

## 2017-07-10 ENCOUNTER — Encounter: Payer: Self-pay | Admitting: Family Medicine

## 2017-07-10 ENCOUNTER — Ambulatory Visit (INDEPENDENT_AMBULATORY_CARE_PROVIDER_SITE_OTHER): Payer: Managed Care, Other (non HMO) | Admitting: Family Medicine

## 2017-07-10 VITALS — BP 98/64 | HR 62 | Temp 98.2°F | Ht 72.0 in | Wt 274.8 lb

## 2017-07-10 DIAGNOSIS — Z114 Encounter for screening for human immunodeficiency virus [HIV]: Secondary | ICD-10-CM

## 2017-07-10 DIAGNOSIS — E1121 Type 2 diabetes mellitus with diabetic nephropathy: Secondary | ICD-10-CM

## 2017-07-10 DIAGNOSIS — Z0000 Encounter for general adult medical examination without abnormal findings: Secondary | ICD-10-CM

## 2017-07-10 DIAGNOSIS — R3911 Hesitancy of micturition: Secondary | ICD-10-CM

## 2017-07-10 LAB — POCT UA - MICROALBUMIN: MICROALBUMIN (UR) POC: 50 mg/L

## 2017-07-10 LAB — POCT URINALYSIS DIPSTICK
Bilirubin, UA: NEGATIVE
Glucose, UA: NEGATIVE
KETONES UA: NEGATIVE
Leukocytes, UA: NEGATIVE
Nitrite, UA: NEGATIVE
PROTEIN UA: NEGATIVE
RBC UA: NEGATIVE
SPEC GRAV UA: 1.025 (ref 1.010–1.025)
Urobilinogen, UA: 0.2 E.U./dL
pH, UA: 6 (ref 5.0–8.0)

## 2017-07-10 LAB — POCT GLYCOSYLATED HEMOGLOBIN (HGB A1C): Hemoglobin A1C: 7.1

## 2017-07-10 NOTE — Progress Notes (Signed)
Patient: Ronald Shah, Male    DOB: June 03, 1961, 56 y.o.   MRN: [id number] Visit Date: 07/10/2017  Today's Provider: Vernie Murders, PA   Chief Complaint  Patient presents with  . Annual Exam   Subjective:    Annual physical exam Ronald Shah is a 56 y.o. male who presents today for health maintenance and complete physical. He feels well. He reports exercising walking at work. He reports he is sleeping well.  -----------------------------------------------------------------   Review of Systems  Constitutional: Negative.   HENT: Negative.   Eyes: Negative.   Respiratory: Negative.   Cardiovascular: Negative.   Gastrointestinal: Positive for blood in stool.       History of hemorrhoidal bleeding occasionally.  Endocrine: Negative.   Genitourinary: Positive for difficulty urinating.  Musculoskeletal: Positive for back pain.  Skin: Negative.   Allergic/Immunologic: Negative.   Neurological: Negative.   Hematological: Negative.   Psychiatric/Behavioral: Negative.     Social History      He  reports that he has been smoking.  He has never used smokeless tobacco. He reports that he drinks alcohol. He reports that he does not use drugs.       Social History   Social History  . Marital status: Married    Spouse name: N/A  . Number of children: N/A  . Years of education: N/A   Social History Main Topics  . Smoking status: Current Some Day Smoker  . Smokeless tobacco: Never Used  . Alcohol use Yes  . Drug use: No  . Sexual activity: Not Asked   Other Topics Concern  . None   Social History Narrative  . None    Past Medical History:  Diagnosis Date  . History of chicken pox      Patient Active Problem List   Diagnosis Date Noted  . Elevated PSA 09/21/2016  . HLD (hyperlipidemia) 06/07/2015  . LBP (low back pain) 06/07/2015  . Microalbuminuria 06/07/2015  . Diabetes mellitus with nephropathy ([place]) 07/12/2009  . Family history of cardiovascular  disease 06/01/2008  . Morbid obesity ([place]) 06/01/2008  . Personal history of tobacco use, presenting hazards to health 06/01/2008    No past surgical history on file.  Family History        Family Status  Relation Status  . Mother Alive  . Father Deceased at age 65       Died from an MI        His family history includes Diabetes in his mother.     No Known Allergies   Current Outpatient Prescriptions:  .  glipiZIDE (GLUCOTROL) 5 MG tablet, TAKE 1 TABLET BY MOUTH TWICE DAILY, Disp: 180 tablet, Rfl: 3 .  metFORMIN (GLUCOPHAGE-XR) 500 MG 24 hr tablet, TAKE 2 TABLETS BY MOUTH TWICE DAILY, Disp: 360 tablet, Rfl: 4 .  pravastatin (PRAVACHOL) 40 MG tablet, TAKE 1 TABLET BY MOUTH DAILY, Disp: 90 tablet, Rfl: 1 .  sitaGLIPtin (JANUVIA) 100 MG tablet, Take 1 tablet (100 mg total) by mouth daily., Disp: 30 tablet, Rfl: 6   Patient Care Team: Birdie Sons, MD as PCP - General (Family Medicine)      Objective:   Vitals: BP 98/64 (BP Location: Right Arm, Patient Position: Sitting, Cuff Size: Normal)   Pulse 62   Temp 98.2 F (36.8 C) (Oral)   Ht 6' (1.829 m)   Wt 274 lb 12.8 oz (124.6 kg)   SpO2 97%   BMI 37.27 kg/m  BP Readings from  Last 3 Encounters:  07/10/17 98/64  07/18/16 118/74  02/15/16 104/70   Wt Readings from Last 3 Encounters:  07/10/17 274 lb 12.8 oz (124.6 kg)  07/18/16 273 lb (123.8 kg)  02/15/16 283 lb (128.4 kg)   Vitals:   07/10/17 1010  BP: 98/64  Pulse: 62  Temp: 98.2 F (36.8 C)  TempSrc: Oral  SpO2: 97%  Weight: 274 lb 12.8 oz (124.6 kg)  Height: 6' (1.829 m)     Physical Exam  Constitutional: He is oriented to person, place, and time. He appears well-developed and well-nourished.  HENT:  Head: Normocephalic and atraumatic.  Right Ear: External ear normal.  Left Ear: External ear normal.  Nose: Nose normal.  Mouth/Throat: Oropharynx is clear and moist.  Eyes: Pupils are equal, round, and reactive to light. Conjunctivae and EOM are  normal. Right eye exhibits no discharge.  Neck: Normal range of motion. Neck supple. No tracheal deviation present. No thyromegaly present.  Cardiovascular: Normal rate, regular rhythm, normal heart sounds and intact distal pulses.   No murmur heard. Pulmonary/Chest: Effort normal and breath sounds normal. No respiratory distress. He has no wheezes. He has no rales. He exhibits no tenderness.  Abdominal: Soft. He exhibits no distension and no mass. There is no tenderness. There is no rebound and no guarding.  Genitourinary: Rectum normal and penis normal. Rectal exam shows guaiac negative stool.  Musculoskeletal: Normal range of motion. He exhibits no edema or tenderness.  Lymphadenopathy:    He has no cervical adenopathy.  Neurological: He is alert and oriented to person, place, and time. He has normal reflexes. No cranial nerve deficit. He exhibits normal muscle tone. Coordination normal.  Skin: Skin is warm and dry. No rash noted. No erythema.  Psychiatric: He has a normal mood and affect. His behavior is normal. Judgment and thought content normal.   Depression Screen PHQ 2/9 Scores 07/10/2017 06/09/2015  PHQ - 2 Score 0 0  PHQ- 9 Score - 0   Assessment & Plan:     Routine Health Maintenance and Physical Exam  Exercise Activities and Dietary recommendations Goals    Walk a great deal at work but no regimented program. Encouraged to exercise 30 minutes 4 days a week and work on weight loss.      Immunization History  Administered Date(s) Administered  . Hepatitis B 06/11/2012, 07/12/2012, 12/16/2012  . Pneumococcal Polysaccharide-23 03/08/2012  . Tdap 06/01/2008    Health Maintenance  Topic Date Due  . HIV Screening  05/11/1975  . FOOT EXAM  06/08/2016  . PNEUMOCOCCAL POLYSACCHARIDE VACCINE (2) 12/16/2017 (Originally 03/08/2017)  . INFLUENZA VACCINE  12/16/2017 (Originally 04/18/2017)  . OPHTHALMOLOGY EXAM  07/10/2018 (Originally 05/10/1970)  . HEMOGLOBIN A1C  01/08/2018  .  TETANUS/TDAP  06/01/2018  . URINE MICROALBUMIN  07/10/2018  . COLONOSCOPY  06/24/2022  . Hepatitis C Screening  Completed     Discussed health benefits of physical activity, and encouraged him to engage in regular exercise appropriate for his age and condition.    -------------------------------------------------------------------- 1. Annual physical exam General health stable. Need to continue to work on obesity which will help with diabetes control. Immunizations up to date - wishes to postpone flu shot. Will get routine labs. - POCT Urinalysis Dipstick - CBC with Differential/Platelet - Comprehensive metabolic panel - Lipid panel - TSH  2. Diabetes mellitus with nephropathy ([facility]) Continues to take the Glipizide 5 mg BID, Metformin 500 mg 2 tablets BID and Januvia 100 mg qd. Unchanged microalbumin level  and Hgb A1C 7.1% today (better than a year ago). Continue statin and get CBC, CMP and Lipid Panel. Encouraged to get annual eye exam. Recheck pending lab reports. - POCT UA - Microalbumin - POCT HgB A1C - CBC with Differential/Platelet - Comprehensive metabolic panel - Lipid panel  3. Morbid obesity ([place]) BMI 37+ today. Has lost 9 lbs sine May 2017. Continue to reduce caloric intake and exercise 30 minutes 3-4 days a week. Recheck labs. - CBC with Differential/Platelet - Comprehensive metabolic panel - Lipid panel - TSH  4. Urinary hesitancy More often notices slow stream and having to wait for flow to start (mostly in the evening). No hematuria or nocturia. Recheck labs with PSA. No suspicious lesions on exam. - CBC with Differential/Platelet - Comprehensive metabolic panel - PSA  5. Screening for HIV (human immunodeficiency virus) - HIV antibody    Vernie Murders, PA  [facility]

## 2017-07-13 ENCOUNTER — Other Ambulatory Visit: Payer: Self-pay | Admitting: Family Medicine

## 2017-07-13 DIAGNOSIS — E1121 Type 2 diabetes mellitus with diabetic nephropathy: Secondary | ICD-10-CM

## 2017-07-13 MED ORDER — GLIPIZIDE 5 MG PO TABS: 5.0000 mg | ORAL_TABLET | Freq: Two times a day (BID) | ORAL | 0 refills | Status: DC

## 2017-07-26 ENCOUNTER — Encounter: Payer: Self-pay | Admitting: Family Medicine

## 2017-10-23 ENCOUNTER — Other Ambulatory Visit: Payer: Self-pay | Admitting: Family Medicine

## 2017-10-23 MED ORDER — SITAGLIPTIN PHOSPHATE 100 MG PO TABS: 100.0000 mg | ORAL_TABLET | Freq: Every day | ORAL | 0 refills | Status: DC

## 2017-10-23 NOTE — Telephone Encounter (Signed)
Pharmacy requesting refills. Thanks!  

## 2017-10-23 NOTE — Telephone Encounter (Signed)
[facility] faxed a refill request for the following medication. Thanks CC  sitaGLIPtin (JANUVIA) 100 MG tablet

## 2018-01-04 ENCOUNTER — Other Ambulatory Visit: Payer: Self-pay | Admitting: Family Medicine

## 2018-02-06 ENCOUNTER — Other Ambulatory Visit: Payer: Self-pay | Admitting: Family Medicine

## 2018-03-06 ENCOUNTER — Other Ambulatory Visit: Payer: Self-pay | Admitting: Family Medicine

## 2018-03-12 ENCOUNTER — Other Ambulatory Visit: Payer: Self-pay | Admitting: Family Medicine

## 2018-03-12 NOTE — Telephone Encounter (Signed)
Patient is overdue for diabetes follow up and needs to schedule before refill can be approved.

## 2018-03-13 NOTE — Telephone Encounter (Signed)
Patient advised and follow up appointment scheduled for 03/26/2018 at 9:40am.

## 2018-03-26 ENCOUNTER — Ambulatory Visit: Payer: Managed Care, Other (non HMO) | Admitting: Family Medicine

## 2018-03-26 ENCOUNTER — Encounter: Payer: Self-pay | Admitting: Family Medicine

## 2018-03-26 VITALS — BP 120/80 | HR 72 | Temp 98.3°F | Resp 16 | Ht 72.0 in | Wt 269.6 lb

## 2018-03-26 DIAGNOSIS — R972 Elevated prostate specific antigen [PSA]: Secondary | ICD-10-CM | POA: Diagnosis not present

## 2018-03-26 DIAGNOSIS — E785 Hyperlipidemia, unspecified: Secondary | ICD-10-CM | POA: Diagnosis not present

## 2018-03-26 DIAGNOSIS — E1121 Type 2 diabetes mellitus with diabetic nephropathy: Secondary | ICD-10-CM

## 2018-03-26 LAB — POCT GLYCOSYLATED HEMOGLOBIN (HGB A1C)
Est. average glucose Bld gHb Est-mCnc: 192
HEMOGLOBIN A1C: 8.3 % — AB (ref 4.0–5.6)

## 2018-03-26 NOTE — Progress Notes (Signed)
Patient: Ronald Shah Male    DOB: 04-02-1961   57 y.o.   MRN: [id number] Visit Date: 03/26/2018  Today's Provider: Lelon Huh, MD   Chief Complaint  Patient presents with  . Follow-up    Diabetes and HLD   Subjective:    HPI  Diabetes Mellitus Type II, Follow-up:   Lab Results  Component Value Date   HGBA1C 8.3 (A) 03/26/2018   HGBA1C 7.1 07/10/2017   HGBA1C 7.5 (H) 07/18/2016    Last seen for diabetes 9 months ago.  Management since then includes no changes.Continue current medications. He reports excellent compliance with treatment. He is not having side effects.  Current symptoms include none and have been stable. Home blood sugar records: 140's  Episodes of hypoglycemia? no   Current Insulin Regimen: n/a Most Recent Eye Exam: not up to date. Weight trend: stable Prior visit with dietician: no Current diet: in general, an "unhealthy" diet Current exercise: walking and yard work  Pertinent Labs:    Component Value Date/Time   CHOL 147 02/15/2016 0855   TRIG 106 02/15/2016 0855   HDL 44 02/15/2016 0855   LDLCALC 82 02/15/2016 0855   CREATININE 1.10 07/18/2016 1055    Wt Readings from Last 3 Encounters:  03/26/18 269 lb 9.6 oz (122.3 kg)  07/10/17 274 lb 12.8 oz (124.6 kg)  07/18/16 273 lb (123.8 kg)    ------------------------------------------------------------------------  Lipid/Cholesterol, Follow-up:   Last seen for this9 months ago.  Management changes since that visit include none.Labs ordered but none done . Last Lipid Panel:    Component Value Date/Time   CHOL 147 02/15/2016 0855   TRIG 106 02/15/2016 0855   HDL 44 02/15/2016 0855   CHOLHDL 3.3 02/15/2016 0855   LDLCALC 82 02/15/2016 0855    Risk factors for vascular disease include diabetes mellitus, hypercholesterolemia and smoking  He reports excellent compliance with treatment. He is not having side effects.  Current symptoms include none and have been  stable. Weight trend: stable Prior visit with dietician: no Current diet: in general, an "unhealthy" diet Current exercise: walking  Wt Readings from Last 3 Encounters:  03/26/18 269 lb 9.6 oz (122.3 kg)  07/10/17 274 lb 12.8 oz (124.6 kg)  07/18/16 273 lb (123.8 kg)    -------------------------------------------------------------------     No Known Allergies   Current Outpatient Medications:  .  glipiZIDE (GLUCOTROL) 5 MG tablet, Take 1 tablet (5 mg total) by mouth 2 (two) times daily., Disp: 60 tablet, Rfl: 0 .  metFORMIN (GLUCOPHAGE-XR) 500 MG 24 hr tablet, TAKE 2 TABLETS BY MOUTH TWICE DAILY, Disp: 120 tablet, Rfl: 1 .  pravastatin (PRAVACHOL) 40 MG tablet, TAKE 1 TABLET BY MOUTH EVERY DAY, Disp: 30 tablet, Rfl: 1 .  sitaGLIPtin (JANUVIA) 100 MG tablet, Take 1 tablet (100 mg total) by mouth daily., Disp: 30 tablet, Rfl: 1  Review of Systems  Constitutional: Negative for appetite change, chills and fever.  Eyes: Positive for pain.  Respiratory: Negative for chest tightness, shortness of breath and wheezing.   Cardiovascular: Negative for chest pain and palpitations.  Gastrointestinal: Negative for abdominal pain, nausea and vomiting.     Social History   Tobacco Use  . Smoking status: Current Some Day Smoker  . Smokeless tobacco: Never Used  Substance Use Topics  . Alcohol use: Yes   Objective:   BP 120/80 (BP Location: Right Arm, Patient Position: Sitting, Cuff Size: Normal)   Pulse 72   Temp 98.3  F (36.8 C) (Oral)   Resp 16   Ht 6' (1.829 m)   Wt 269 lb 9.6 oz (122.3 kg)   BMI 36.56 kg/m    Physical Exam  General Appearance:    Alert, cooperative, no distress, obese  Eyes:    PERRL, conjunctiva/corneas clear, EOM's intact       Lungs:     Clear to auscultation bilaterally, respirations unlabored  Heart:    Regular rate and rhythm  Neurologic:   Awake, alert, oriented x 3. No apparent focal neurological           defect.       Results for orders  placed or performed in visit on 03/26/18  POCT glycosylated hemoglobin (Hb A1C)  Result Value Ref Range   Hemoglobin A1C 8.3 (A) 4.0 - 5.6 %   Est. average glucose Bld gHb Est-mCnc 192        Assessment & Plan:     1. Diabetes mellitus with nephropathy ([place]) A1c up today, he feels he can make some improvements in diet and exercise to lose weight. Counseled on additional medications such as SGLT2  Inhibitor of GLP1 agonist. If a1c continues to rise, will consider adding another medication.  - POCT glycosylated hemoglobin (Hb A1C) - Renal function panel  2. Hyperlipidemia, unspecified hyperlipidemia type He is tolerating pravastatin well with no adverse effects.   - Direct LDL  3. Elevated PSA  - PSA Total (Reflex To Free)       Lelon Huh, MD  [facility]

## 2018-03-26 NOTE — Patient Instructions (Signed)
   Reduce portion sizes and increase exercise to lose 8-12 pounds over the next 4 months.    It is recommended to engage in 150 minutes of moderate exercise every week.

## 2018-03-27 ENCOUNTER — Telehealth: Payer: Self-pay

## 2018-03-27 LAB — RENAL FUNCTION PANEL
ALBUMIN: 4.1 g/dL (ref 3.5–5.5)
BUN/Creatinine Ratio: 18 (ref 9–20)
BUN: 18 mg/dL (ref 6–24)
CHLORIDE: 103 mmol/L (ref 96–106)
CO2: 22 mmol/L (ref 20–29)
Calcium: 9.8 mg/dL (ref 8.7–10.2)
Creatinine, Ser: 0.98 mg/dL (ref 0.76–1.27)
GFR, EST AFRICAN AMERICAN: 99 mL/min/{1.73_m2} (ref >59)
GFR, EST NON AFRICAN AMERICAN: 85 mL/min/{1.73_m2} (ref >59)
GLUCOSE: 183 mg/dL — AB (ref 65–99)
PHOSPHORUS: 2.7 mg/dL (ref 2.5–4.5)
POTASSIUM: 4.6 mmol/L (ref 3.5–5.2)
SODIUM: 139 mmol/L (ref 134–144)

## 2018-03-27 LAB — PSA TOTAL (REFLEX TO FREE): Prostate Specific Ag, Serum: 3.4 ng/mL (ref 0.0–4.0)

## 2018-03-27 LAB — LDL CHOLESTEROL, DIRECT: LDL DIRECT: 72 mg/dL (ref 0–99)

## 2018-03-27 NOTE — Telephone Encounter (Signed)
-----   Message from Birdie Sons, MD sent at 03/27/2018  7:45 AM EDT ----- LDL cholesterol is 72, should be under 70. Rest of labs are good. Cut back on saturated fats (I.e. Red meats), and avoid sweets and starchy foods. Continue current medications.  Follow up in October, as scheduled.

## 2018-03-27 NOTE — Telephone Encounter (Signed)
Patient advised as directed below.  Thanks,  -Garnetta Fedrick 

## 2018-05-23 ENCOUNTER — Other Ambulatory Visit: Payer: Self-pay | Admitting: Family Medicine

## 2018-06-27 ENCOUNTER — Other Ambulatory Visit: Payer: Self-pay | Admitting: Family Medicine

## 2018-07-09 ENCOUNTER — Ambulatory Visit (INDEPENDENT_AMBULATORY_CARE_PROVIDER_SITE_OTHER): Payer: Managed Care, Other (non HMO) | Admitting: Family Medicine

## 2018-07-09 ENCOUNTER — Encounter: Payer: Self-pay | Admitting: Family Medicine

## 2018-07-09 VITALS — BP 110/70 | HR 63 | Temp 97.8°F | Resp 16 | Ht 72.0 in | Wt 269.0 lb

## 2018-07-09 DIAGNOSIS — Z0000 Encounter for general adult medical examination without abnormal findings: Secondary | ICD-10-CM | POA: Diagnosis not present

## 2018-07-09 DIAGNOSIS — R3911 Hesitancy of micturition: Secondary | ICD-10-CM

## 2018-07-09 DIAGNOSIS — E1121 Type 2 diabetes mellitus with diabetic nephropathy: Secondary | ICD-10-CM | POA: Diagnosis not present

## 2018-07-09 LAB — POCT GLYCOSYLATED HEMOGLOBIN (HGB A1C)
Est. average glucose Bld gHb Est-mCnc: 177
Hemoglobin A1C: 7.8 % — AB (ref 4.0–5.6)

## 2018-07-09 NOTE — Progress Notes (Signed)
Patient: Ronald Shah, Male    DOB: [birth year], 57 y.o.   MRN: [id number] Visit Date: 07/09/2018  Today's Provider: Lelon Huh, MD   Chief Complaint  Patient presents with  . Annual Exam  . Hyperlipidemia  . Diabetes   Subjective:    Annual physical exam Ronald Shah is a 57 y.o. male who presents today for health maintenance and complete physical. He feels fairly well. He reports exercising daily. He reports he is sleeping fairly well.  -----------------------------------------------------------------  Lipid/Cholesterol, Follow-up:   Last seen for this 3 months ago.  Management changes since that visit include encouraging patient to cut back on saturated fats, sweets and starchy foods in diet. . Last Lipid Panel:    Component Value Date/Time   CHOL 147 02/15/2016 0855   TRIG 106 02/15/2016 0855   HDL 44 02/15/2016 0855   CHOLHDL 3.3 02/15/2016 0855   LDLCALC 82 02/15/2016 0855   LDLDIRECT 72 03/26/2018 1044    Risk factors for vascular disease include diabetes mellitus and hypercholesterolemia  He reports good compliance with treatment. He is not having side effects.  Current symptoms include none and have been stable. Weight trend: stable Prior visit with dietician: no Current diet: in general, an "unhealthy" diet Current exercise: walking (on the job)  Wt Readings from Last 3 Encounters:  07/09/18 269 lb (122 kg)  03/26/18 269 lb 9.6 oz (122.3 kg)  07/10/17 274 lb 12.8 oz (124.6 kg)    -------------------------------------------------------------------  Diabetes Mellitus Type II, Follow-up:   Lab Results  Component Value Date   HGBA1C 8.3 (A) 03/26/2018   HGBA1C 7.1 07/10/2017   HGBA1C 7.5 (H) 07/18/2016    Last seen for diabetes 3 months ago.  Management since then includes no changes. He reports good compliance with treatment. He is not having side effects.  Current symptoms include none and have been stable. Home blood sugar  records: blood sugars are not checked  Episodes of hypoglycemia? no   Current Insulin Regimen: none Most Recent Eye Exam: 1 year ago Weight trend: stable Prior visit with dietician: no Current diet: in general, an "unhealthy" diet Current exercise: walking  Pertinent Labs:    Component Value Date/Time   CHOL 147 02/15/2016 0855   TRIG 106 02/15/2016 0855   HDL 44 02/15/2016 0855   LDLCALC 82 02/15/2016 0855   CREATININE 0.98 03/26/2018 1044    Wt Readings from Last 3 Encounters:  07/09/18 269 lb (122 kg)  03/26/18 269 lb 9.6 oz (122.3 kg)  07/10/17 274 lb 12.8 oz (124.6 kg)    ------------------------------------------------------------------------ Elevated PSA: Patient was last seen for this problem 3 months ago and no changes were made. He does have some urinary hesitancy in the morning, but no nocturia or polyuria.   Marland Kitchenresulfastpsa1 Prostate Specific Ag, Serum  Date Value Ref Range Status  03/26/2018 3.4 0.0 - 4.0 ng/mL Final    Comment:    Roche ECLIA methodology. According to the American Urological Association, Serum PSA should decrease and remain at undetectable levels after radical prostatectomy. The AUA defines biochemical recurrence as an initial PSA value 0.2 ng/mL or greater followed by a subsequent confirmatory PSA value 0.2 ng/mL or greater. Values obtained with different assay methods or kits cannot be used interchangeably. Results cannot be interpreted as absolute evidence of the presence or absence of malignant disease.      Review of Systems  Constitutional: Negative for appetite change, chills, fatigue and fever.  HENT:  Negative for congestion, ear pain, hearing loss, nosebleeds and trouble swallowing.   Eyes: Negative for pain and visual disturbance.  Respiratory: Negative for cough, chest tightness and shortness of breath.   Cardiovascular: Negative for chest pain, palpitations and leg swelling.  Gastrointestinal: Negative for abdominal pain,  blood in stool, constipation, diarrhea, nausea and vomiting.  Endocrine: Negative for polydipsia, polyphagia and polyuria.  Genitourinary: Positive for difficulty urinating. Negative for dysuria and flank pain.  Musculoskeletal: Negative for arthralgias, back pain, joint swelling, myalgias and neck stiffness.  Skin: Negative for color change, rash and wound.  Neurological: Negative for dizziness, tremors, seizures, speech difficulty, weakness, light-headedness and headaches.  Psychiatric/Behavioral: Negative for behavioral problems, confusion, decreased concentration, dysphoric mood and sleep disturbance. The patient is not nervous/anxious.   All other systems reviewed and are negative.   Social History      He  reports that he has been smoking e-cigarettes. He has never used smokeless tobacco. He reports that he drinks alcohol. He reports that he does not use drugs.       Social History   Socioeconomic History  . Marital status: Married    Spouse name: Not on file  . Number of children: 5  . Years of education: Not on file  . Highest education level: Not on file  Occupational History  . Occupation: [employer]  Social Needs  . Financial resource strain: Not on file  . Food insecurity:    Worry: Not on file    Inability: Not on file  . Transportation needs:    Medical: Not on file    Non-medical: Not on file  Tobacco Use  . Smoking status: Current Some Day Smoker    Types: E-cigarettes  . Smokeless tobacco: Never Used  Substance and Sexual Activity  . Alcohol use: Yes    Comment: drinks 2-3 times a week  . Drug use: No  . Sexual activity: Not on file  Lifestyle  . Physical activity:    Days per week: Not on file    Minutes per session: Not on file  . Stress: Not on file  Relationships  . Social connections:    Talks on phone: Not on file    Gets together: Not on file    Attends religious service: Not on file    Active member of club or organization: Not on file     Attends meetings of clubs or organizations: Not on file    Relationship status: Not on file  Other Topics Concern  . Not on file  Social History Narrative  . Not on file    Past Medical History:  Diagnosis Date  . History of chicken pox      Patient Active Problem List   Diagnosis Date Noted  . Elevated PSA 09/21/2016  . HLD (hyperlipidemia) 06/07/2015  . LBP (low back pain) 06/07/2015  . Microalbuminuria 06/07/2015  . Diabetes mellitus with nephropathy ([place]) 07/12/2009  . Family history of cardiovascular disease 06/01/2008  . Morbid obesity ([place]) 06/01/2008  . Personal history of tobacco use, presenting hazards to health 06/01/2008    No past surgical history on file.  Family History        Family Status  Relation Name Status  . Mother  Alive  . Father  Deceased at age 61       Died from an MI        His family history includes Diabetes in his mother.      No Known Allergies  Current Outpatient Medications:  .  glipiZIDE (GLUCOTROL) 5 MG tablet, TAKE 1 TABLET BY MOUTH TWICE DAILY, Disp: 180 tablet, Rfl: 3 .  JANUVIA 100 MG tablet, TAKE ONE TABLET BY MOUTH EVERY DAY, Disp: 30 tablet, Rfl: 5 .  metFORMIN (GLUCOPHAGE-XR) 500 MG 24 hr tablet, TAKE 2 TABLETS BY MOUTH TWICE DAILY (NEED TO MAKE APPOINTMENT), Disp: 240 tablet, Rfl: 5 .  pravastatin (PRAVACHOL) 40 MG tablet, TAKE 1 TABLET BY MOUTH EVERY DAY (NEED TO MAKE APPOINTMENT), Disp: 60 tablet, Rfl: 5   Patient Care Team: Birdie Sons, MD as PCP - General (Family Medicine)      Objective:   Vitals: BP 110/70 (BP Location: Left Arm, Patient Position: Sitting, Cuff Size: Large)   Pulse 63   Temp 97.8 F (36.6 C) (Oral)   Resp 16   Ht 6' (1.829 m)   Wt 269 lb (122 kg)   SpO2 98% Comment: room air  BMI 36.48 kg/m    Vitals:   07/09/18 0904  BP: 110/70  Pulse: 63  Resp: 16  Temp: 97.8 F (36.6 C)  TempSrc: Oral  SpO2: 98%  Weight: 269 lb (122 kg)  Height: 6' (1.829 m)     Physical  Exam   General Appearance:    Alert, cooperative, no distress, appears stated age, obese  Head:    Normocephalic, without obvious abnormality, atraumatic  Eyes:    PERRL, conjunctiva/corneas clear, EOM's intact, fundi    benign, both eyes       Ears:    Normal TM's and external ear canals, both ears  Nose:   Nares normal, septum midline, mucosa normal, no drainage   or sinus tenderness  Throat:   Lips, mucosa, and tongue normal; teeth and gums normal  Neck:   Supple, symmetrical, trachea midline, no adenopathy;       thyroid:  No enlargement/tenderness/nodules; no carotid   bruit or JVD  Back:     Symmetric, no curvature, ROM normal, no CVA tenderness  Lungs:     Clear to auscultation bilaterally, respirations unlabored  Chest wall:    No tenderness or deformity  Heart:    Regular rate and rhythm, S1 and S2 normal, no murmur, rub   or gallop  Abdomen:     Soft, non-tender, bowel sounds active all four quadrants,    no masses, no organomegaly  Genitalia:    deferred  Rectal:    deferred  Extremities:   Extremities normal, atraumatic, no cyanosis or edema  Pulses:   2+ and symmetric all extremities  Skin:   Skin color, texture, turgor normal, no rashes or lesions  Lymph nodes:   Cervical, supraclavicular, and axillary nodes normal  Neurologic:   CNII-XII intact. Normal strength, sensation and reflexes      throughout    Depression Screen PHQ 2/9 Scores 07/09/2018 07/10/2017 06/09/2015  PHQ - 2 Score 0 0 0  PHQ- 9 Score 0 - 0    Results for orders placed or performed in visit on 07/09/18  POCT HgB A1C  Result Value Ref Range   Hemoglobin A1C 7.8 (A) 4.0 - 5.6 %   HbA1c POC (<> result, manual entry)     HbA1c, POC (prediabetic range)     HbA1c, POC (controlled diabetic range)     Est. average glucose Bld gHb Est-mCnc 177      Assessment & Plan:     Routine Health Maintenance and Physical Exam  Exercise Activities and Dietary recommendations Goals  None      Immunization History  Administered Date(s) Administered  . Hepatitis B 06/11/2012, 07/12/2012, 12/16/2012  . Pneumococcal Polysaccharide-23 03/08/2012  . Tdap 06/01/2008    Health Maintenance  Topic Date Due  . HIV Screening  05/11/1975  . FOOT EXAM  06/08/2016  . TETANUS/TDAP  06/01/2018  . OPHTHALMOLOGY EXAM  07/10/2018 (Originally 05/10/1970)  . INFLUENZA VACCINE  02/14/2019 (Originally 04/18/2018)  . URINE MICROALBUMIN  07/10/2018  . HEMOGLOBIN A1C  09/26/2018  . COLONOSCOPY  06/24/2022  . PNEUMOCOCCAL POLYSACCHARIDE VACCINE AGE 4-64 HIGH RISK  Completed  . Hepatitis C Screening  Completed     Discussed health benefits of physical activity, and encouraged him to engage in regular exercise appropriate for his age and condition.    --------------------------------------------------------------------  1. Annual physical exam Generally doing well.   2. Diabetes mellitus with nephropathy ([place]) Improving. Continue current medications.  Continue to work on improving diet and losing weight.  Follow up 4 months  - POCT HgB A1C  3. Urinary hesitancy Can try OTC Saw Palmetto. Not bothering him enough to require prescription medications.   4. Morbid obesity ([place]) Diet and exercise.    Lelon Huh, MD  [facility]

## 2018-07-09 NOTE — Patient Instructions (Addendum)
   You can try OTC Saw Palmetto to help with prostate and urination   Please stop smoking  . You are due for a Td (tetanus-diptheria vaccine) which protects you from tetanus and diptheria. Please check with your insurance plan or pharmacy regarding coverage for this vaccine.   . I recommend that you get a flu vaccine this year. Please call our office at [phone] at your earliest convenience to schedule a flu shot.   . Please contact your eyecare professional to schedule a routine eye exam  . I recommend that you take 81mg  enteric coated aspirin to reduce risk of vascular events such as heart attacks and strokes.

## 2018-11-12 ENCOUNTER — Ambulatory Visit: Payer: Self-pay | Admitting: Family Medicine

## 2018-11-13 ENCOUNTER — Encounter: Payer: Self-pay | Admitting: Family Medicine

## 2018-11-13 ENCOUNTER — Ambulatory Visit: Payer: Managed Care, Other (non HMO) | Admitting: Family Medicine

## 2018-11-13 VITALS — BP 114/72 | HR 80 | Temp 98.3°F | Resp 16 | Wt 265.0 lb

## 2018-11-13 DIAGNOSIS — R809 Proteinuria, unspecified: Secondary | ICD-10-CM

## 2018-11-13 DIAGNOSIS — E1121 Type 2 diabetes mellitus with diabetic nephropathy: Secondary | ICD-10-CM

## 2018-11-13 DIAGNOSIS — E785 Hyperlipidemia, unspecified: Secondary | ICD-10-CM

## 2018-11-13 LAB — POCT UA - MICROALBUMIN: Microalbumin Ur, POC: 50 mg/L

## 2018-11-13 LAB — POCT GLYCOSYLATED HEMOGLOBIN (HGB A1C): Hemoglobin A1C: 11.4 % — AB (ref 4.0–5.6)

## 2018-11-13 NOTE — Progress Notes (Signed)
PO      Patient: Ronald Shah Male    DOB: Mar 04, 1961   58 y.o.   MRN: [id number] Visit Date: 11/13/2018  Today's Provider: Lelon Huh, MD   Chief Complaint  Patient presents with  . Hyperlipidemia  . Diabetes   Subjective:     HPI     Diabetes Mellitus Type II, Follow-up:   Lab Results  Component Value Date   HGBA1C 7.8 (A) 07/09/2018   HGBA1C 8.3 (A) 03/26/2018   HGBA1C 7.1 07/10/2017   Last seen for diabetes 4 months ago.  Management since then includes to continue working on lifestyle changes. He reports excellent compliance with treatment. He is not having side effects.  Current symptoms include none and have been stable. Home blood sugar records: fasting range: have been improving since changing is diet.    Episodes of hypoglycemia? no    He states he had been consuming a lot of carbs the last few months and his sugars had gotten up into the 200s consistently. Over the last week he started back on very low carb diet and his sugar is now running in the 90s fasting.   ------------------------------------------------------------------------   Lipid/Cholesterol, Follow-up:   Last seen for this 4 months ago.  Management since that visit includes No changes.  Last Lipid Panel:    Component Value Date/Time   CHOL 147 02/15/2016 0855   TRIG 106 02/15/2016 0855   HDL 44 02/15/2016 0855   CHOLHDL 3.3 02/15/2016 0855   LDLCALC 82 02/15/2016 0855   LDLDIRECT 72 03/26/2018 1044    He reports excellent compliance with treatment. He is not having side effects.   Wt Readings from Last 3 Encounters:  11/13/18 265 lb (120.2 kg)  07/09/18 269 lb (122 kg)  03/26/18 269 lb 9.6 oz (122.3 kg)   BP Readings from Last 3 Encounters:  11/13/18 114/72  07/09/18 110/70  03/26/18 120/80    ------------------------------------------------------------------------    No Known Allergies   Current Outpatient Medications:  .  glipiZIDE (GLUCOTROL) 5 MG tablet,  TAKE 1 TABLET BY MOUTH TWICE DAILY, Disp: 180 tablet, Rfl: 3 .  JANUVIA 100 MG tablet, TAKE ONE TABLET BY MOUTH EVERY DAY, Disp: 30 tablet, Rfl: 5 .  metFORMIN (GLUCOPHAGE-XR) 500 MG 24 hr tablet, TAKE 2 TABLETS BY MOUTH TWICE DAILY (NEED TO MAKE APPOINTMENT), Disp: 240 tablet, Rfl: 5 .  pravastatin (PRAVACHOL) 40 MG tablet, TAKE 1 TABLET BY MOUTH EVERY DAY (NEED TO MAKE APPOINTMENT), Disp: 60 tablet, Rfl: 5  Review of Systems  Constitutional: Negative.   Respiratory: Positive for cough (Pt is getting over a cold but is feeling better. ). Negative for choking, chest tightness, shortness of breath, wheezing and stridor.   Cardiovascular: Negative.   Gastrointestinal: Negative.   Endocrine: Negative.   Musculoskeletal: Negative.   Neurological: Negative for dizziness, light-headedness and headaches.    Social History   Tobacco Use  . Smoking status: Current Some Day Smoker    Types: E-cigarettes  . Smokeless tobacco: Never Used  Substance Use Topics  . Alcohol use: Yes    Comment: drinks 2-3 times a week      Objective:   BP 114/72 (BP Location: Right Arm, Patient Position: Sitting, Cuff Size: Large)   Pulse 80   Temp 98.3 F (36.8 C) (Oral)   Resp 16   Wt 265 lb (120.2 kg)   BMI 35.94 kg/m  Vitals:   11/13/18 0853  BP: 114/72  Pulse: 80  Resp: 16  Temp: 98.3 F (36.8 C)  TempSrc: Oral  Weight: 265 lb (120.2 kg)     Physical Exam  General Appearance:    Alert, cooperative, no distress, obese  Eyes:    PERRL, conjunctiva/corneas clear, EOM's intact       Lungs:     Clear to auscultation bilaterally, respirations unlabored  Heart:    Regular rate and rhythm  Neurologic:   Awake, alert, oriented x 3. No apparent focal neurological           defect.       Results for orders placed or performed in visit on 11/13/18  POCT glycosylated hemoglobin (Hb A1C)  Result Value Ref Range   Hemoglobin A1C 11.4 (A) 4.0 - 5.6 %  POCT UA - Microalbumin  Result Value Ref Range     Microalbumin Ur, POC 50 mg/L       Assessment & Plan    1. Diabetes mellitus with nephropathy ([place]) Uncontrolled due to poor diet. He has recently made significant dietary improvement and seeing his sugars come down significantly. Discussed adding another agent such as SGLT2 inhibitor or GLP-1 agonist. Will reassess in 2 months  - POCT glycosylated hemoglobin (Hb A1C) - POCT UA - Microalbumin  2. Hyperlipidemia, unspecified hyperlipidemia type He is tolerating pravastatin well with no adverse effects.    3. Microalbuminuria Hold off on ACEI due to relative low BP and may need to add SGLP2 inhibitor at follow up.      Lelon Huh, MD  [facility]

## 2018-11-13 NOTE — Patient Instructions (Signed)
Please contact your eyecare professional to schedule a routine eye exam  . Please review the attached list of medications and notify my office if there are any errors.   . Please bring all of your medications to every appointment so we can make sure that our medication list is the same as yours.   

## 2019-01-13 ENCOUNTER — Ambulatory Visit: Payer: Managed Care, Other (non HMO) | Admitting: Family Medicine

## 2019-01-15 ENCOUNTER — Other Ambulatory Visit: Payer: Self-pay

## 2019-01-15 ENCOUNTER — Ambulatory Visit: Payer: Managed Care, Other (non HMO) | Admitting: Family Medicine

## 2019-01-15 ENCOUNTER — Encounter: Payer: Self-pay | Admitting: Family Medicine

## 2019-01-15 VITALS — BP 106/73 | HR 69 | Temp 97.9°F | Resp 16 | Wt 256.0 lb

## 2019-01-15 DIAGNOSIS — E1121 Type 2 diabetes mellitus with diabetic nephropathy: Secondary | ICD-10-CM | POA: Diagnosis not present

## 2019-01-15 LAB — POCT GLYCOSYLATED HEMOGLOBIN (HGB A1C)
Est. average glucose Bld gHb Est-mCnc: 157
Hemoglobin A1C: 7.1 % — AB (ref 4.0–5.6)

## 2019-01-15 NOTE — Patient Instructions (Signed)
Please contact your eyecare professional to schedule a routine eye exam  . Please review the attached list of medications and notify my office if there are any errors.   . Please bring all of your medications to every appointment so we can make sure that our medication list is the same as yours.   

## 2019-01-15 NOTE — Progress Notes (Signed)
Patient: Ronald Shah Male    DOB: 1961/06/10   58 y.o.   MRN: [id number] Visit Date: 01/15/2019  Today's Provider: Lelon Huh, MD   Chief Complaint  Patient presents with  . Diabetes   Subjective:     HPI  Diabetes Mellitus Type II, Follow-up:   Lab Results  Component Value Date   HGBA1C 11.4 (A) 11/13/2018   HGBA1C 7.8 (A) 07/09/2018   HGBA1C 8.3 (A) 03/26/2018    Last seen for diabetes 2 months ago.  Management since then includes no changes. He reports poor compliance with treatment. Stopped taking Januvia about 3 weeks due to it causing stomach pain when eating  Home blood sugar records: fasting range: 100-140  Episodes of hypoglycemia? no  He has been eating much healthier and walking every day since last visit and has lost about 9 pounds.  Most Recent Eye Exam: >1 year ago Weight trend: decreasing steadily Prior visit with dietician: No Current exercise: walking Current diet habits: low carb diet  Pertinent Labs:    Component Value Date/Time   CHOL 147 02/15/2016 0855   TRIG 106 02/15/2016 0855   HDL 44 02/15/2016 0855   LDLCALC 82 02/15/2016 0855   CREATININE 0.98 03/26/2018 1044    Wt Readings from Last 3 Encounters:  01/15/19 256 lb (116.1 kg)  11/13/18 265 lb (120.2 kg)  07/09/18 269 lb (122 kg)    ------------------------------------------------------------------------  No Known Allergies   Current Outpatient Medications:  .  glipiZIDE (GLUCOTROL) 5 MG tablet, TAKE 1 TABLET BY MOUTH TWICE DAILY, Disp: 180 tablet, Rfl: 3 .  metFORMIN (GLUCOPHAGE-XR) 500 MG 24 hr tablet, TAKE 2 TABLETS BY MOUTH TWICE DAILY (NEED TO MAKE APPOINTMENT), Disp: 240 tablet, Rfl: 5 .  pravastatin (PRAVACHOL) 40 MG tablet, TAKE 1 TABLET BY MOUTH EVERY DAY (NEED TO MAKE APPOINTMENT), Disp: 60 tablet, Rfl: 5 .  JANUVIA 100 MG tablet, TAKE ONE TABLET BY MOUTH EVERY DAY (Patient not taking: Reported on 01/15/2019), Disp: 30 tablet, Rfl: 5  Review of Systems   Constitutional: Negative for appetite change, chills and fever.  Respiratory: Negative for chest tightness, shortness of breath and wheezing.   Cardiovascular: Negative for chest pain and palpitations.  Gastrointestinal: Negative for abdominal pain, nausea and vomiting.    Social History   Tobacco Use  . Smoking status: Current Some Day Smoker    Types: E-cigarettes  . Smokeless tobacco: Never Used  Substance Use Topics  . Alcohol use: Yes    Comment: drinks 2-3 times a week      Objective:   BP 106/73 (BP Location: Right Arm, Patient Position: Sitting, Cuff Size: Large)   Pulse 69   Temp 97.9 F (36.6 C) (Oral)   Resp 16   Wt 256 lb (116.1 kg)   BMI 34.72 kg/m  Vitals:   01/15/19 0812  BP: 106/73  Pulse: 69  Resp: 16  Temp: 97.9 F (36.6 C)  TempSrc: Oral  Weight: 256 lb (116.1 kg)     Physical Exam   General Appearance:    Alert, cooperative, no distress  Eyes:    PERRL, conjunctiva/corneas clear, EOM's intact       Lungs:     Clear to auscultation bilaterally, respirations unlabored  Heart:    Regular rate and rhythm  Neurologic:   Awake, alert, oriented x 3. No apparent focal neurological           defect.  Results for orders placed or performed in visit on 01/15/19  POCT HgB A1C  Result Value Ref Range   Hemoglobin A1C 7.1 (A) 4.0 - 5.6 %   HbA1c POC (<> result, manual entry)     HbA1c, POC (prediabetic range)     HbA1c, POC (controlled diabetic range)     Est. average glucose Bld gHb Est-mCnc 157        Assessment & Plan     1. Diabetes mellitus with nephropathy ([place]) Now off of Januvia, but a1c and home blood sugars much better with dietary changes. Continue glipizide and metformin for the time being. Consider adding Invokana due to nephropathy.   - POCT HgB A1C  Follow up in 3 months     Lelon Huh, MD  [facility]

## 2019-04-21 ENCOUNTER — Ambulatory Visit (INDEPENDENT_AMBULATORY_CARE_PROVIDER_SITE_OTHER): Payer: Managed Care, Other (non HMO) | Admitting: Family Medicine

## 2019-04-21 ENCOUNTER — Other Ambulatory Visit: Payer: Self-pay

## 2019-04-21 ENCOUNTER — Encounter: Payer: Self-pay | Admitting: Family Medicine

## 2019-04-21 VITALS — BP 110/73 | HR 66 | Temp 98.2°F | Wt 249.0 lb

## 2019-04-21 DIAGNOSIS — E785 Hyperlipidemia, unspecified: Secondary | ICD-10-CM | POA: Diagnosis not present

## 2019-04-21 DIAGNOSIS — Z125 Encounter for screening for malignant neoplasm of prostate: Secondary | ICD-10-CM | POA: Diagnosis not present

## 2019-04-21 DIAGNOSIS — E1121 Type 2 diabetes mellitus with diabetic nephropathy: Secondary | ICD-10-CM | POA: Diagnosis not present

## 2019-04-21 LAB — POCT GLYCOSYLATED HEMOGLOBIN (HGB A1C): Hemoglobin A1C: 6.8 % — AB (ref 4.0–5.6)

## 2019-04-21 NOTE — Patient Instructions (Signed)
.   Please review the attached list of medications and notify my office if there are any errors.   . Please bring all of your medications to every appointment so we can make sure that our medication list is the same as yours.   . We will have flu vaccines available after Labor Day. Please go to your pharmacy or call the office in early September to schedule you flu shot.   

## 2019-04-21 NOTE — Progress Notes (Signed)
Patient: Ronald Shah Male    DOB: 17-Mar-1961   58 y.o.   MRN: [id number] Visit Date: 04/21/2019  Today's Provider: Lelon Huh, MD   Chief Complaint  Patient presents with  . Diabetes  . Hyperlipidemia   Subjective:     HPI     Diabetes Mellitus Type II, Follow-up:   Lab Results  Component Value Date   HGBA1C 7.1 (A) 01/15/2019   HGBA1C 11.4 (A) 11/13/2018   HGBA1C 7.8 (A) 07/09/2018   Last seen for diabetes 3 months ago.  Management since then includes no changes. He reports excellent compliance with treatment. He is not having side effects.  Current symptoms include none and have been stable. Home blood sugar records: Mid 100's  Episodes of hypoglycemia? no   Current Insulin Regimen: none Most Recent Eye Exam: pt is due for an eye exam Weight trend: stable Current diet: in general, a "healthy" diet   Current exercise: walking     Lipid/Cholesterol, Follow-up:   Last seen for this 3 months ago.  Management since that visit includes None.  Last Lipid Panel:    Component Value Date/Time   CHOL 147 02/15/2016 0855   TRIG 106 02/15/2016 0855   HDL 44 02/15/2016 0855   CHOLHDL 3.3 02/15/2016 0855   LDLCALC 82 02/15/2016 0855   LDLDIRECT 72 03/26/2018 1044    He reports excellent compliance with treatment. He is not having side effects.   Wt Readings from Last 3 Encounters:  04/21/19 249 lb (112.9 kg)  01/15/19 256 lb (116.1 kg)  11/13/18 265 lb (120.2 kg)    ------------------------------------------------------------------------    No Known Allergies   Current Outpatient Medications:  .  glipiZIDE (GLUCOTROL) 5 MG tablet, TAKE 1 TABLET BY MOUTH TWICE DAILY, Disp: 180 tablet, Rfl: 3 .  metFORMIN (GLUCOPHAGE-XR) 500 MG 24 hr tablet, TAKE 2 TABLETS BY MOUTH TWICE DAILY (NEED TO MAKE APPOINTMENT), Disp: 240 tablet, Rfl: 5 .  pravastatin (PRAVACHOL) 40 MG tablet, TAKE 1 TABLET BY MOUTH EVERY DAY (NEED TO MAKE APPOINTMENT), Disp: 60  tablet, Rfl: 5  Review of Systems  Constitutional: Negative.   Respiratory: Negative.   Cardiovascular: Negative.   Gastrointestinal: Negative.   Endocrine: Negative.   Musculoskeletal: Negative.   Neurological: Negative for dizziness, light-headedness and headaches.    Social History   Tobacco Use  . Smoking status: Current Some Day Smoker    Types: E-cigarettes  . Smokeless tobacco: Never Used  Substance Use Topics  . Alcohol use: Yes    Comment: drinks 2-3 times a week      Objective:   BP 110/73 (BP Location: Right Arm, Patient Position: Sitting, Cuff Size: Large)   Pulse 66   Temp 98.2 F (36.8 C) (Oral)   Wt 249 lb (112.9 kg)   BMI 33.77 kg/m  Vitals:   04/21/19 0821  BP: 110/73  Pulse: 66  Temp: 98.2 F (36.8 C)  TempSrc: Oral  Weight: 249 lb (112.9 kg)     Physical Exam   General Appearance:    Alert, cooperative, no distress  Eyes:    PERRL, conjunctiva/corneas clear, EOM's intact       Lungs:     Clear to auscultation bilaterally, respirations unlabored  Heart:    Normal heart rate. Normal rhythm. No murmurs, rubs, or gallops.   Neurologic:   Awake, alert, oriented x 3. No apparent focal neurological           defect.  A1c=6.8%    Assessment & Plan    1. Diabetes mellitus with nephropathy ([place]) Doing well on current medications.   2. Hyperlipidemia, unspecified hyperlipidemia type He is tolerating pravastatin well with no adverse effects.   - Lipid panel - Comprehensive metabolic panel  3. Prostate cancer screening  - PSA     Lelon Huh, MD  [facility]

## 2019-04-22 ENCOUNTER — Telehealth: Payer: Self-pay

## 2019-04-22 LAB — COMPREHENSIVE METABOLIC PANEL
ALT: 20 IU/L (ref 0–44)
AST: 14 IU/L (ref 0–40)
Albumin/Globulin Ratio: 1.5 (ref 1.2–2.2)
Albumin: 4.2 g/dL (ref 3.8–4.9)
Alkaline Phosphatase: 69 IU/L (ref 39–117)
BUN/Creatinine Ratio: 17 (ref 9–20)
BUN: 18 mg/dL (ref 6–24)
Bilirubin Total: 0.8 mg/dL (ref 0.0–1.2)
CO2: 24 mmol/L (ref 20–29)
Calcium: 9.4 mg/dL (ref 8.7–10.2)
Chloride: 104 mmol/L (ref 96–106)
Creatinine, Ser: 1.04 mg/dL (ref 0.76–1.27)
GFR calc Af Amer: 91 mL/min/{1.73_m2} (ref >59)
GFR calc non Af Amer: 79 mL/min/{1.73_m2} (ref >59)
Globulin, Total: 2.8 g/dL (ref 1.5–4.5)
Glucose: 121 mg/dL — ABNORMAL HIGH (ref 65–99)
Potassium: 4.4 mmol/L (ref 3.5–5.2)
Sodium: 140 mmol/L (ref 134–144)
Total Protein: 7 g/dL (ref 6.0–8.5)

## 2019-04-22 LAB — LIPID PANEL
Chol/HDL Ratio: 2.5 ratio (ref 0.0–5.0)
Cholesterol, Total: 121 mg/dL (ref 100–199)
HDL: 48 mg/dL (ref >39)
LDL Calculated: 58 mg/dL (ref 0–99)
Triglycerides: 75 mg/dL (ref 0–149)
VLDL Cholesterol Cal: 15 mg/dL (ref 5–40)

## 2019-04-22 LAB — PSA: Prostate Specific Ag, Serum: 4.6 ng/mL — ABNORMAL HIGH (ref 0.0–4.0)

## 2019-04-22 NOTE — Telephone Encounter (Signed)
LMTCB 04/22/2019   Thanks,   -Laura  

## 2019-04-22 NOTE — Telephone Encounter (Signed)
-----   Message from Birdie Sons, MD sent at 04/22/2019  8:24 AM EDT ----- Cholesterol is good at 121. Normal kidney and liver functions. psa is a little elevated, need to recheck psa in 3-4 weeks to see if trending up. Will contact him at end of month when time to recheck.

## 2019-04-28 NOTE — Telephone Encounter (Signed)
Patient notified of lab results

## 2019-04-28 NOTE — Telephone Encounter (Signed)
LMTCB 04/28/2019  Thanks,   -Laura  

## 2019-05-13 ENCOUNTER — Telehealth: Payer: Self-pay | Admitting: Family Medicine

## 2019-05-13 DIAGNOSIS — R972 Elevated prostate specific antigen [PSA]: Secondary | ICD-10-CM

## 2019-05-13 NOTE — Telephone Encounter (Signed)
Time to recheck psa since it was elevated last month  Please advise, print order and leave at lab. Thanks.

## 2019-05-13 NOTE — Telephone Encounter (Signed)
LMTCB 05/13/2019  Thanks,   -Mickel Baas

## 2019-05-15 NOTE — Telephone Encounter (Signed)
LMTCB 05/15/2019  Thanks,   -Mickel Baas

## 2019-05-16 NOTE — Telephone Encounter (Signed)
Patient notified to pick up orders to have PSA drawn.

## 2019-05-28 LAB — PSA TOTAL (REFLEX TO FREE): Prostate Specific Ag, Serum: 4.5 ng/mL — ABNORMAL HIGH (ref 0.0–4.0)

## 2019-05-28 LAB — FPSA% REFLEX
% FREE PSA: 14.9 %
PSA, FREE: 0.67 ng/mL

## 2019-05-28 NOTE — Addendum Note (Signed)
Addended by: Lelon Huh E on: 05/28/2019 02:02 PM   Modules accepted: Orders

## 2019-05-28 NOTE — Telephone Encounter (Signed)
PSA is still elevated, need referral to urology for evaluation. Have sent referral to [facility].

## 2019-06-25 ENCOUNTER — Other Ambulatory Visit: Payer: Self-pay | Admitting: Family Medicine

## 2019-06-30 LAB — HM DIABETES EYE EXAM

## 2019-07-01 ENCOUNTER — Encounter: Payer: Self-pay | Admitting: Urology

## 2019-07-01 ENCOUNTER — Ambulatory Visit: Payer: Managed Care, Other (non HMO) | Admitting: Urology

## 2019-07-01 ENCOUNTER — Other Ambulatory Visit: Payer: Self-pay

## 2019-07-01 VITALS — BP 129/84 | HR 67 | Ht 72.0 in | Wt 253.0 lb

## 2019-07-01 DIAGNOSIS — N401 Enlarged prostate with lower urinary tract symptoms: Secondary | ICD-10-CM

## 2019-07-01 DIAGNOSIS — R3912 Poor urinary stream: Secondary | ICD-10-CM | POA: Diagnosis not present

## 2019-07-01 DIAGNOSIS — R972 Elevated prostate specific antigen [PSA]: Secondary | ICD-10-CM | POA: Diagnosis not present

## 2019-07-01 DIAGNOSIS — R351 Nocturia: Secondary | ICD-10-CM | POA: Diagnosis not present

## 2019-07-01 NOTE — Patient Instructions (Signed)

## 2019-07-01 NOTE — Progress Notes (Signed)
07/01/2019 12:39 PM   NOVA TIENKEN [birth year] [id number]  Referring provider: Birdie Sons, [address] [place],  [place] [address]  Chief Complaint  Patient presents with  . Elevated PSA    New Patient    HPI: 58 year old male with a personal history of rising/elevated PSA who presents today for further evaluation of this.  He has no family history of prostate cancer.  No weight loss or bone pain.  He does note that he gets up now twice at night to void and particularly at nighttime, his urinary stream is very weak.  Because it takes him so long to empty at night, he feels like this causes him difficulty going back to sleep.  In the daytime, he does have some urinary frequency but this is less bothersome to him.  He can hold it if he needs to without significant urgency.  His stream is better in the daytime.  No dysuria or gross hematuria.  His symptoms have been progressing over the past several years.  Not currently on any BPH medications.  PSA trend: 2.1 06/2014 3.0 06/2015 3.9 06/2016 3.9 09/2016 3.4  05/2018 4.6 04/2019 4.5  05/2019 --> free PSA 0.67, 14.9%, 24% risk of malignancy  PMH: Past Medical History:  Diagnosis Date  . Diabetes mellitus without complication ([place])   . History of chicken pox   . Hyperlipidemia     Surgical History: No past surgical history on file.  Home Medications:  Allergies as of 07/01/2019   No Known Allergies     Medication List       Accurate as of July 01, 2019 12:39 PM. If you have any questions, ask your nurse or doctor.        glipiZIDE 5 MG tablet Commonly known as: GLUCOTROL TAKE 1 TABLET BY MOUTH TWICE DAILY   metFORMIN 500 MG 24 hr tablet Commonly known as: GLUCOPHAGE-XR TAKE 2 TABLETS BY MOUTH TWICE DAILY   pravastatin 40 MG tablet Commonly known as: PRAVACHOL TAKE 1 TABLET BY MOUTH EVERY DAY       Allergies: No Known Allergies  Family History: Family History  Problem  Relation Age of Onset  . Diabetes Mother     Social History:  reports that he has been smoking e-cigarettes. He has never used smokeless tobacco. He reports current alcohol use. He reports that he does not use drugs.  ROS: UROLOGY Frequent Urination?: Yes Hard to postpone urination?: No Burning/pain with urination?: No Get up at night to urinate?: No Leakage of urine?: No Urine stream starts and stops?: No Trouble starting stream?: Yes Do you have to strain to urinate?: No Blood in urine?: No Urinary tract infection?: No Sexually transmitted disease?: No Injury to kidneys or bladder?: No Painful intercourse?: No Weak stream?: Yes Erection problems?: Yes Penile pain?: No  Gastrointestinal Nausea?: No Vomiting?: No Indigestion/heartburn?: No Diarrhea?: No Constipation?: No  Constitutional Fever: No Night sweats?: No Weight loss?: No Fatigue?: No  Skin Skin rash/lesions?: No Itching?: No  Eyes Blurred vision?: No Double vision?: No  Ears/Nose/Throat Sore throat?: No Sinus problems?: No  Hematologic/Lymphatic Swollen glands?: No Easy bruising?: No  Cardiovascular Leg swelling?: No Chest pain?: No  Respiratory Cough?: No Shortness of breath?: No  Endocrine Excessive thirst?: No  Musculoskeletal Back pain?: No Joint pain?: No  Neurological Headaches?: No Dizziness?: No  Psychologic Depression?: No Anxiety?: No  Physical Exam: BP 129/84   Pulse 67   Ht 6' (1.829 m)  Wt 253 lb (114.8 kg)   BMI 34.31 kg/m   Constitutional:  Alert and oriented, No acute distress. HEENT: [place] AT, moist mucus membranes.  Trachea midline, no masses. Cardiovascular: No clubbing, cyanosis, or edema. Respiratory: Normal respiratory effort, no increased work of breathing. GI: Abdomen is soft, nontender, nondistended, no abdominal masses Rectal: Normal sphincter tone.  Enlarged prostate, 50 cc, nontender, no nodules. Skin: No rashes, bruises or suspicious  lesions. Neurologic: Grossly intact, no focal deficits, moving all 4 extremities. Psychiatric: Normal mood and affect.  Laboratory Data: Lab Results  Component Value Date   CREATININE 1.04 04/21/2019   Lab Results  Component Value Date   HGBA1C 6.8 (A) 04/21/2019   PSA trend as above   Pertinent Imaging: n/a  Assessment & Plan:    1. Elevated PSA  We reviewed the implications of an elevated PSA and the uncertainty surrounding it. In general, a man's PSA increases with age and is produced by both normal and cancerous prostate tissue. Differential for elevated PSA is BPH, prostate cancer, infection, recent intercourse/ejaculation, prostate infarction, recent urethroscopic manipulation (foley placement/cystoscopy) and prostatitis. Management of an elevated PSA can include observation or prostate biopsy and wediscussed this in detail. We discussed that indications for prostate biopsy are defined by age and race specific PSA cutoffs as well as a PSA velocity of 0.75/year.  Overall, his PSA has been trending upwards for several years with a somewhat concerning PSA velocity.  His absolute PSA is also higher than would be expected for his age with an elevated free PSA all of which are somewhat concerning for underlying prostate cancer.  We discussed options including continued very close surveillance, prostate imaging and for prostate MRI versus prostate biopsy for more definitive diagnostic evaluation.  After discussion today, he is interested in pursuing prostate biopsy. We discussed prostate biopsy in detail including the procedure itself, the risks of blood in the urine, stool, and ejaculate, serious infection, and discomfort. He is willing to proceed with this as discussed. - PSA  2. BPH associated with nocturia Prostamegaly on exam today likely contributing to symptoms  Depending on prostate biopsy results, may discuss outlet procedures versus pharmacotherapy to manage his symptoms   3. Weak urinary stream As above   Return for prostate biopsy.  [name], MD  [facility] [address] [place], [place] [address] [phone]

## 2019-07-02 ENCOUNTER — Telehealth: Payer: Self-pay | Admitting: *Deleted

## 2019-07-02 LAB — PSA: Prostate Specific Ag, Serum: 4.8 ng/mL — ABNORMAL HIGH (ref 0.0–4.0)

## 2019-07-02 NOTE — Telephone Encounter (Addendum)
Patient informed-verbalized understanding  ----- Message from [name], MD sent at 07/02/2019  8:02 AM EDT ----- PSA recheck continues to show rise.  Strongly agree with pursuing prostate biopsy as discussed.    [name], MD

## 2019-07-22 ENCOUNTER — Other Ambulatory Visit: Payer: Self-pay

## 2019-07-22 ENCOUNTER — Other Ambulatory Visit: Payer: Self-pay | Admitting: Urology

## 2019-07-22 ENCOUNTER — Ambulatory Visit: Payer: Managed Care, Other (non HMO) | Admitting: Urology

## 2019-07-22 ENCOUNTER — Encounter: Payer: Self-pay | Admitting: Urology

## 2019-07-22 VITALS — BP 123/80 | HR 60 | Ht 72.0 in | Wt 252.0 lb

## 2019-07-22 DIAGNOSIS — R972 Elevated prostate specific antigen [PSA]: Secondary | ICD-10-CM

## 2019-07-22 MED ORDER — LEVOFLOXACIN 500 MG PO TABS
500.0000 mg | ORAL_TABLET | Freq: Once | ORAL | Status: AC
  Administered 2019-07-22: 500 mg via ORAL

## 2019-07-22 MED ORDER — GENTAMICIN SULFATE 40 MG/ML IJ SOLN
80.0000 mg | Freq: Once | INTRAMUSCULAR | Status: AC
  Administered 2019-07-22: 80 mg via INTRAMUSCULAR

## 2019-07-22 NOTE — Progress Notes (Signed)
   07/22/19  CC:  Chief Complaint  Patient presents with  . Prostate Biopsy    HPI: 58 yo M with elevated, rising PSA who presents for biopsy today.  Height 6' (1.829 m). NED. A&Ox3.   No respiratory distress   Abd soft, NT, ND Normal sphincter tone  Prostate Biopsy Procedure   Informed consent was obtained after discussing risks/benefits of the procedure.  A time out was performed to ensure correct patient identity.  Pre-Procedure: - Last PSA Level:  Lab Results  Component Value Date   PSA 2.1 07/08/2014   - Gentamicin given prophylactically - Levaquin 500 mg administered PO -Transrectal Ultrasound performed revealing a 61.21 gm prostate -No significant hypoechoic or median lobe noted  Procedure: - Prostate block performed using 10 cc 1% lidocaine and biopsies taken from sextant areas, a total of 12 under ultrasound guidance.  Post-Procedure: - Patient tolerated the procedure well - He was counseled to seek immediate medical attention if experiences any severe pain, significant bleeding, or fevers - Return in two week to discuss biopsy results   [name], MD

## 2019-07-22 NOTE — Addendum Note (Signed)
Addended by: Verlene Mayer A on: 07/22/2019 10:36 AM   Modules accepted: Orders

## 2019-07-31 LAB — ANATOMIC PATHOLOGY REPORT: PDF Image: 0

## 2019-08-05 ENCOUNTER — Ambulatory Visit: Payer: Managed Care, Other (non HMO) | Admitting: Urology

## 2019-08-08 ENCOUNTER — Encounter: Payer: Self-pay | Admitting: Urology

## 2019-08-08 ENCOUNTER — Other Ambulatory Visit: Payer: Self-pay

## 2019-08-08 ENCOUNTER — Ambulatory Visit (INDEPENDENT_AMBULATORY_CARE_PROVIDER_SITE_OTHER): Payer: Managed Care, Other (non HMO) | Admitting: Urology

## 2019-08-08 VITALS — BP 116/73 | HR 69 | Ht 72.0 in | Wt 249.0 lb

## 2019-08-08 DIAGNOSIS — N5203 Combined arterial insufficiency and corporo-venous occlusive erectile dysfunction: Secondary | ICD-10-CM | POA: Diagnosis not present

## 2019-08-08 DIAGNOSIS — C61 Malignant neoplasm of prostate: Secondary | ICD-10-CM

## 2019-08-08 DIAGNOSIS — N401 Enlarged prostate with lower urinary tract symptoms: Secondary | ICD-10-CM | POA: Diagnosis not present

## 2019-08-08 DIAGNOSIS — R351 Nocturia: Secondary | ICD-10-CM

## 2019-08-08 DIAGNOSIS — R3912 Poor urinary stream: Secondary | ICD-10-CM | POA: Diagnosis not present

## 2019-08-08 MED ORDER — TAMSULOSIN HCL 0.4 MG PO CAPS: 0.4000 mg | ORAL_CAPSULE | Freq: Every day | ORAL | 11 refills | Status: DC

## 2019-08-08 NOTE — Progress Notes (Signed)
08/08/2019 3:01 PM   Ronald Shah 1961/06/23 [id number]  Referring provider: Birdie Sons, [address] [address],  [place] [address]  Chief Complaint  Patient presents with  . Elevated PSA    Biopsy results    HPI: 58 year old male who presents today to discuss his prostate biopsy results with newly diagnosed low risk prostate cancer.  He is noted to have a rising PSA recently up to 4.6 on 04/2019.  He underwent prostate biopsy on 07/22/2019 which was uncomplicated.  12 core biopsy revealed 1 of 3 cores with Gleason 3+3, 10% at the right lateral apex.  There is also a suspicious area at the right lateral mid but not diagnostic for prostate cancer.  There is other areas of acute and chronic inflammation within the specimen.  TRUS volume 61.2 cc.    He is concerned about urinary symptoms today.  He does have weak stream and some hesitancy especially in the morning time.  IPSS below.  He currently takes no medications for this but does have fairly significant bother from this.  In addition as above, he is worried today about erectile dysfunction.  His wife does not feel that it is a problem but he feels like it is a little bit more of an issue.  He occasionally has issues achieving erection but this is not consistent.  IPSS    Row Name 08/08/19 1100         International Prostate Symptom Score   How often have you had the sensation of not emptying your bladder?  Less than 1 in 5     How often have you had to urinate less than every two hours?  More than half the time     How often have you found you stopped and started again several times when you urinated?  About half the time     How often have you found it difficult to postpone urination?  Not at All     How often have you had a weak urinary stream?  More than half the time     How often have you had to strain to start urination?  More than half the time     How many times did you typically get up at night  to urinate?  3 Times     Total IPSS Score  19       Quality of Life due to urinary symptoms   If you were to spend the rest of your life with your urinary condition just the way it is now how would you feel about that?  Mixed        Score:  1-7 Mild 8-19 Moderate 20-35 Severe  SHIM    Row Name 08/08/19 1149         SHIM: Over the last 6 months:   How do you rate your confidence that you could get and keep an erection?  High     When you had erections with sexual stimulation, how often were your erections hard enough for penetration (entering your partner)?  Most Times (much more than half the time)     During sexual intercourse, how often were you able to maintain your erection after you had penetrated (entered) your partner?  Most Times (much more than half the time)     During sexual intercourse, how difficult was it to maintain your erection to completion of intercourse?  Slightly Difficult     When you attempted  sexual intercourse, how often was it satisfactory for you?  Most Times (much more than half the time)       SHIM Total Score   SHIM  20         PMH: Past Medical History:  Diagnosis Date  . Diabetes mellitus without complication ([place])   . History of chicken pox   . Hyperlipidemia     Surgical History: History reviewed. No pertinent surgical history.  Home Medications:  Allergies as of 08/08/2019   No Known Allergies     Medication List       Accurate as of August 08, 2019  3:01 PM. If you have any questions, ask your nurse or doctor.        glipiZIDE 5 MG tablet Commonly known as: GLUCOTROL TAKE 1 TABLET BY MOUTH TWICE DAILY   metFORMIN 500 MG 24 hr tablet Commonly known as: GLUCOPHAGE-XR TAKE 2 TABLETS BY MOUTH TWICE DAILY   pravastatin 40 MG tablet Commonly known as: PRAVACHOL TAKE 1 TABLET BY MOUTH EVERY DAY   tamsulosin 0.4 MG Caps capsule Commonly known as: Flomax Take 1 capsule (0.4 mg total) by mouth daily. Started by: [name], MD       Allergies: No Known Allergies  Family History: Family History  Problem Relation Age of Onset  . Diabetes Mother     Social History:  reports that he has been smoking e-cigarettes. He has never used smokeless tobacco. He reports current alcohol use. He reports that he does not use drugs.  ROS: UROLOGY Frequent Urination?: No Hard to postpone urination?: No Burning/pain with urination?: No Get up at night to urinate?: No Leakage of urine?: No Urine stream starts and stops?: Yes Trouble starting stream?: No Do you have to strain to urinate?: No Blood in urine?: No Urinary tract infection?: No Sexually transmitted disease?: No Injury to kidneys or bladder?: No Painful intercourse?: No Weak stream?: Yes Erection problems?: Yes Penile pain?: No  Gastrointestinal Nausea?: No Vomiting?: No Indigestion/heartburn?: No Diarrhea?: No Constipation?: No  Constitutional Fever: No Night sweats?: No Weight loss?: No Fatigue?: No  Skin Skin rash/lesions?: No Itching?: No  Eyes Blurred vision?: No Double vision?: No  Ears/Nose/Throat Sore throat?: No Sinus problems?: No  Hematologic/Lymphatic Swollen glands?: No Easy bruising?: No  Cardiovascular Leg swelling?: No Chest pain?: No  Respiratory Cough?: No Shortness of breath?: No  Endocrine Excessive thirst?: No  Musculoskeletal Back pain?: No Joint pain?: No  Neurological Headaches?: No Dizziness?: No  Psychologic Depression?: No Anxiety?: No  Physical Exam: BP 116/73   Pulse 69   Ht 6' (1.829 m)   Wt 249 lb (112.9 kg)   BMI 33.77 kg/m   Constitutional:  Alert and oriented, No acute distress. Wife present by speaker phone today (KIM) HEENT: [place] AT, moist mucus membranes.  Trachea midline, no masses. Cardiovascular: No clubbing, cyanosis, or edema. Respiratory: Normal respiratory effort, no increased work of breathing. Skin: No rashes, bruises or suspicious lesions.  Neurologic: Grossly intact, no focal deficits, moving all 4 extremities. Psychiatric: Normal mood and affect.  Pertinent Imaging: n/a  Assessment & Plan:    1. Prostate cancer [facility]) Newly dx very low risk prostate cancer, gleason 3+3 in single core ~10  The patient was counseled about the natural history of prostate cancer and the standard treatment options that are available for prostate cancer. It was explained to him how his age and life expectancy, clinical stage, Gleason score, and PSA affect his prognosis, the decision to  proceed with additional staging studies, as well as how that information influences recommended treatment strategies. We discussed the roles for active surveillance, radiation therapy, surgical therapy, androgen deprivation, as well as ablative therapy options for the treatment of prostate cancer as appropriate to his individual cancer situation. We discussed the risks and benefits of these options with regard to their impact on cancer control and also in terms of potential adverse events, complications, and impact on quality of life particularly related to urinary, bowel, and sexual function. The patient was encouraged to ask questions throughout the discussion today and all questions were answered to his stated satisfaction. In addition, the patient was providedwith and/or directed to appropriate resources and literature for further education about prostate cancer treatment options.  Most strongly recommended active surveillance to which he and his wife are agreeable.  F/u 6 months with PSA, consider prostate MRI at that point if PSA continues to rise  2. BPH associated with nocturia Symptomatic with obstructive urinary stream Prostomegaly on TRUS, ~60 g Pharmacotherapy vs surgical/ procedural options discussed Patient interested in starting Flomax for the time being, discussed possible side effects including retrograde ejaculation / orthostatic hypotension He  understands long term bladder issues with chronic outlet obstruction  3. Weak urinary stream As above  4. Combined arterial insufficiency and corporo-venous occlusive erectile dysfunction Discussed trial of Viagra, declined for now   Return in about 6 months (around 02/05/2020) for PSA/ IPSS/PVR.  [name], MD  [facility] [address] [place], [place] [address] [phone]  I spent 40 min with this patient of which greater than 50% was spent in counseling and coordination of care with the patient.

## 2019-10-16 ENCOUNTER — Other Ambulatory Visit: Payer: Self-pay | Admitting: *Deleted

## 2019-10-16 MED ORDER — TAMSULOSIN HCL 0.4 MG PO CAPS: 0.4000 mg | ORAL_CAPSULE | Freq: Every day | ORAL | 3 refills | Status: DC

## 2019-10-20 ENCOUNTER — Ambulatory Visit: Payer: Managed Care, Other (non HMO) | Admitting: Family Medicine

## 2019-12-22 ENCOUNTER — Other Ambulatory Visit: Payer: Self-pay | Admitting: Family Medicine

## 2020-02-06 ENCOUNTER — Ambulatory Visit: Payer: Managed Care, Other (non HMO) | Admitting: Urology

## 2020-02-09 ENCOUNTER — Other Ambulatory Visit
Admission: RE | Admit: 2020-02-09 | Discharge: 2020-02-09 | Disposition: A | Discharge status: Home / Self Care | Payer: Managed Care, Other (non HMO) | Attending: Urology | Admitting: Urology

## 2020-02-09 ENCOUNTER — Other Ambulatory Visit: Payer: Self-pay

## 2020-02-09 DIAGNOSIS — C61 Malignant neoplasm of prostate: Secondary | ICD-10-CM | POA: Insufficient documentation

## 2020-02-09 LAB — PSA: Prostatic Specific Antigen: 3.49 ng/mL (ref 0.00–4.00)

## 2020-02-26 NOTE — Progress Notes (Signed)
02/27/20 12:52 PM   TREVIN GARTRELL Jul 28, 1961 [id number]  Referring provider: Birdie Sons, [place],  [place] [address] Chief Complaint  Patient presents with  . Follow-up    45mth follow-up, PSA, IPSS, PVR    HPI: Ronald Shah is a 59 y.o. M who returns today for a 6 month f/u for the evaluation and management of prostate cancer.   He is noted to have a rising PSA recently up to 4.6 on 04/2019.  He underwent prostate biopsy on 07/22/2019 which was uncomplicated.  12 core biopsy revealed 1 of 3 cores with Gleason 3+3, 10% at the right lateral apex.  There is also a suspicious area at the right lateral mid but not diagnostic for prostate cancer.  There is other areas of acute and chronic inflammation within the specimen.  TRUS volume 61.2 cc.    He is currently on Flomax w/o much relief however improved nocturia on some nights.    Most recent PSA 3.29 as of 02/09/20.   PVR 35 mL.   PSA trend: 3.0 07/06/15 3.9 07/18/16 3.9 09/22/16 3.4 03/26/18 4.6 04/21/19 4.5 05/23/19 4.8 07/01/19  3.29 02/09/20    IPSS    Row Name 02/27/20 0900         International Prostate Symptom Score   How often have you had the sensation of not emptying your bladder? Less than 1 in 5     How often have you had to urinate less than every two hours? More than half the time     How often have you found you stopped and started again several times when you urinated? Less than half the time     How often have you found it difficult to postpone urination? Less than 1 in 5 times     How often have you had a weak urinary stream? More than half the time     How often have you had to strain to start urination? Less than 1 in 5 times     How many times did you typically get up at night to urinate? 2 Times     Total IPSS Score 15       Quality of Life due to urinary symptoms   If you were to spend the rest of your life with your urinary condition just the way it is now how  would you feel about that? Mixed            Score:  1-7 Mild 8-19 Moderate 20-35 Severe  PMH: Past Medical History:  Diagnosis Date  . Diabetes mellitus without complication ([place])   . History of chicken pox   . Hyperlipidemia     Surgical History: History reviewed. No pertinent surgical history.  Home Medications:  Allergies as of 02/27/2020   No Known Allergies     Medication List       Accurate as of February 27, 2020 12:52 PM. If you have any questions, ask your nurse or doctor.        STOP taking these medications   tamsulosin 0.4 MG Caps capsule Commonly known as: Flomax Stopped by: [name], MD     TAKE these medications   glipiZIDE 5 MG tablet Commonly known as: GLUCOTROL TAKE 1 TABLET BY MOUTH TWICE DAILY   metFORMIN 500 MG 24 hr tablet Commonly known as: GLUCOPHAGE-XR TAKE 2 TABLETS BY MOUTH TWICE DAILY   pravastatin 40 MG tablet Commonly known as: PRAVACHOL TAKE  1 TABLET BY MOUTH EVERY DAY       Allergies: No Known Allergies  Family History: Family History  Problem Relation Age of Onset  . Diabetes Mother     Social History:  reports that he has been smoking e-cigarettes. He has never used smokeless tobacco. He reports current alcohol use. He reports that he does not use drugs.   Physical Exam: BP 117/79   Pulse 68   Ht 5\' 11"  (1.803 m)   Wt 261 lb (118.4 kg)   BMI 36.40 kg/m   Constitutional:  Alert and oriented, No acute distress. HEENT: [place] AT, moist mucus membranes.  Trachea midline, no masses. Cardiovascular: No clubbing, cyanosis, or edema. Respiratory: Normal respiratory effort, no increased work of breathing. Skin: No rashes, bruises or suspicious lesions. Neurologic: Grossly intact, no focal deficits, moving all 4 extremities. Psychiatric: Normal mood and affect.  Assessment & Plan:    1. Prostate cancer  Newly dx very low risk prostate cancer, gleason 3+3 in single core ~10 PSA down to 3.29 as of 02/09/20    Offered prostate MRI in 6 months for active surveillance, pt accepted  Return in 6 months for PSA/prostate MRI   2. BPH associated with nocturia Prostomegaly on TRUS, ~60 g Pharmacotherapy vs surgical/ procedural options discussed Recommended discontinuing Flomax, not interested in any other medication at this time   Return in about 6 months (around 08/28/2020) for prostate MR/ PSA.  [facility] [address] [place], [place] [address] [phone]  I, Lucas Mallow, am acting as a scribe for Dr. [name],  I have reviewed the above documentation for accuracy and completeness, and I agree with the above.   [name], MD

## 2020-02-27 ENCOUNTER — Ambulatory Visit: Payer: Managed Care, Other (non HMO) | Admitting: Urology

## 2020-02-27 ENCOUNTER — Other Ambulatory Visit: Payer: Self-pay

## 2020-02-27 ENCOUNTER — Encounter: Payer: Self-pay | Admitting: Urology

## 2020-02-27 VITALS — BP 117/79 | HR 68 | Ht 71.0 in | Wt 261.0 lb

## 2020-02-27 DIAGNOSIS — C61 Malignant neoplasm of prostate: Secondary | ICD-10-CM

## 2020-02-27 DIAGNOSIS — N401 Enlarged prostate with lower urinary tract symptoms: Secondary | ICD-10-CM

## 2020-02-27 DIAGNOSIS — R351 Nocturia: Secondary | ICD-10-CM

## 2020-02-27 LAB — BLADDER SCAN AMB NON-IMAGING: Scan Result: 35

## 2020-03-21 ENCOUNTER — Other Ambulatory Visit: Payer: Self-pay | Admitting: Family Medicine

## 2020-03-21 NOTE — Telephone Encounter (Signed)
Requested medications are due for refill today?  Yes  Requested medications are on active medication list? Yes  Last Refill:   12/22/2019  # 360 with no refills  Future visit scheduled? No   Notes to Clinic:  Medication failed RX refill protocol due to no valid encounter in the past 6 months and no labs within the past 180 days.  Last visit was 11 months ago.

## 2020-03-22 ENCOUNTER — Other Ambulatory Visit: Payer: Self-pay | Admitting: Family Medicine

## 2020-03-23 NOTE — Telephone Encounter (Signed)
Requested medication (s) are due for refill today: no  Requested medication (s) are on the active medication list: yes  Last refill:  12/23/2019  Future visit scheduled: no  Notes to clinic:  Patient ive courtesy refills and has not schedule follow up   Requested Prescriptions  Pending Prescriptions Disp Refills   pravastatin (PRAVACHOL) 40 MG tablet [Pharmacy Med Name: PRAVASTATIN TABS 40MG ] 90 tablet 3    Sig: TAKE 1 TABLET BY MOUTH EVERY DAY (SCHEDULE FOLLOW UP APPOINTMENT)      Cardiovascular:  Antilipid - Statins Passed - 03/22/2020 12:15 AM      Passed - Total Cholesterol in normal range and within 360 days    Cholesterol, Total  Date Value Ref Range Status  04/21/2019 121 100 - 199 mg/dL Final          Passed - LDL in normal range and within 360 days    LDL Calculated  Date Value Ref Range Status  04/21/2019 58 0 - 99 mg/dL Final   LDL Direct  Date Value Ref Range Status  03/26/2018 72 0 - 99 mg/dL Final          Passed - HDL in normal range and within 360 days    HDL  Date Value Ref Range Status  04/21/2019 48 >39 mg/dL Final          Passed - Triglycerides in normal range and within 360 days    Triglycerides  Date Value Ref Range Status  04/21/2019 75 0 - 149 mg/dL Final          Passed - Patient is not pregnant      Passed - Valid encounter within last 12 months    Recent Outpatient Visits           11 months ago Diabetes mellitus with nephropathy ([place])   [facility] Birdie Sons, MD   1 year ago Diabetes mellitus with nephropathy [facility])   [facility] Birdie Sons, MD   1 year ago Diabetes mellitus with nephropathy [facility])   [facility] Birdie Sons, MD   1 year ago Annual physical exam   [facility] Birdie Sons, MD   1 year ago Diabetes mellitus with nephropathy [facility])   [facility] Birdie Sons, MD       Future Appointments             In 5 months  [name], MD [facility]              glipiZIDE (GLUCOTROL) 5 MG tablet [Pharmacy Med Name: GLIPIZIDE TABS 5MG ] 180 tablet 3    Sig: TAKE 1 TABLET BY MOUTH TWICE DAILY      Endocrinology:  Diabetes - Sulfonylureas Failed - 03/22/2020 12:15 AM      Failed - HBA1C is between 0 and 7.9 and within 180 days    Hemoglobin A1C  Date Value Ref Range Status  04/21/2019 6.8 (A) 4.0 - 5.6 % Final    Comment:    Average 148   Hgb A1c MFr Bld  Date Value Ref Range Status  07/18/2016 7.5 (H) 4.8 - 5.6 % Final    Comment:             Pre-diabetes: 5.7 - 6.4          Diabetes: >6.4          Glycemic control for adults with diabetes: <7.0  Failed - Valid encounter within last 6 months    Recent Outpatient Visits           11 months ago Diabetes mellitus with nephropathy [facility])   [facility] Birdie Sons, MD   1 year ago Diabetes mellitus with nephropathy [facility])   [facility] Birdie Sons, MD   1 year ago Diabetes mellitus with nephropathy [facility])   [facility] Birdie Sons, MD   1 year ago Annual physical exam   [facility] Birdie Sons, MD   1 year ago Diabetes mellitus with nephropathy [facility])   [facility] Birdie Sons, MD       Future Appointments             In 5 months [name], MD [place]

## 2020-06-19 ENCOUNTER — Other Ambulatory Visit: Payer: Self-pay | Admitting: Family Medicine

## 2020-06-19 NOTE — Telephone Encounter (Signed)
Requested  medications are  due for refill today yes  Requested medications are on the active medication list yes  Last refill 7/6  Last visit Aug 2020  Future visit scheduled Dec 2021  Notes to clinic Failed protocol of valid visit within 6 months

## 2020-08-24 ENCOUNTER — Encounter: Payer: Managed Care, Other (non HMO) | Admitting: Family Medicine

## 2020-08-26 ENCOUNTER — Other Ambulatory Visit: Payer: Self-pay

## 2020-08-26 ENCOUNTER — Ambulatory Visit
Admission: RE | Admit: 2020-08-26 | Discharge: 2020-08-26 | Disposition: A | Discharge status: Home / Self Care | Payer: Managed Care, Other (non HMO) | Source: Ambulatory Visit | Attending: Urology | Admitting: Urology

## 2020-08-26 DIAGNOSIS — C61 Malignant neoplasm of prostate: Secondary | ICD-10-CM | POA: Diagnosis present

## 2020-08-26 DIAGNOSIS — R351 Nocturia: Secondary | ICD-10-CM | POA: Insufficient documentation

## 2020-08-26 DIAGNOSIS — N401 Enlarged prostate with lower urinary tract symptoms: Secondary | ICD-10-CM | POA: Insufficient documentation

## 2020-08-26 IMAGING — MR MR PROSTATE WO/W CM
56 series · 56 of 56 positions shown · IV contrast (10ml Gadavist)
Comparison: None

CLINICAL DATA: Prostate cancer surveillance. History of Gleason 6
disease on prior biopsy in this 59-year-old male

EXAM:
MR PROSTATE WITHOUT AND WITH CONTRAST
TECHNIQUE: Multiplanar multisequence MRI images were obtained of the pelvis
centered about the prostate. Pre and post contrast images were
obtained.
CONTRAST:  10mL GADAVIST GADOBUTROL 1 MMOL/ML IV SOLN

[Series 3: ax in&out whole · axial · 5.0mm · 0.74mm/px · 1 of 70 slices shown]
[im 1/70]
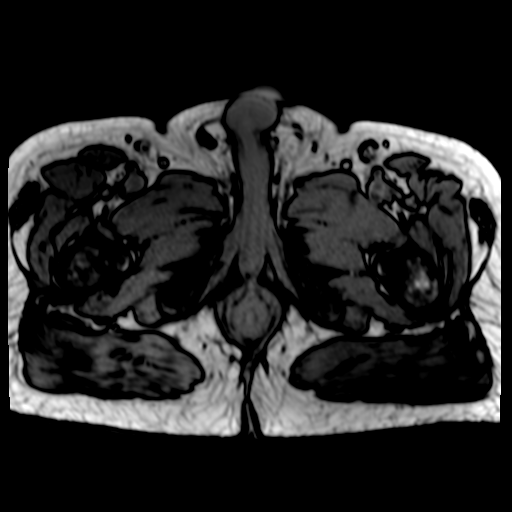

[Series 4: T2 · axial · 3.0mm · 0.56mm/px · 1 of 27 slices shown (1 of 3)]
[im 1/27]
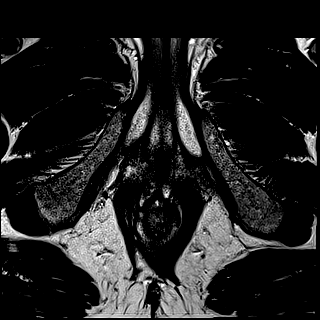

[Series 5: T2 · coronal · 3.0mm · 0.70mm/px · 1 of 35 slices shown (2 of 3)]
[im 1/35]
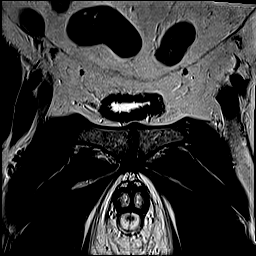

[Series 6: DWI · axial · 3.0mm · 0.86mm/px · 1 of 81 slices shown (1 of 3)]
[im 1/81]
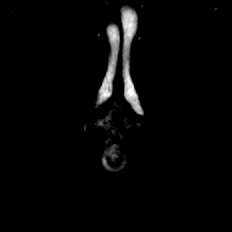

[Series 7: DWI · axial · 3.0mm · 0.86mm/px · 1 of 27 slices shown (2 of 3)]
[im 1/27]
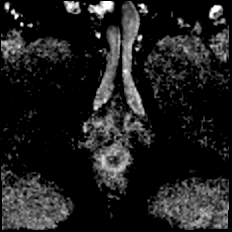

[Series 8: DWI · axial · 3.0mm · 0.86mm/px · 1 of 25 slices shown (3 of 3)]
[im 1/25]
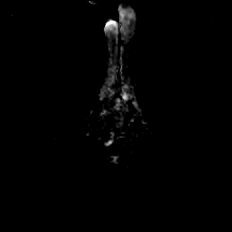

[Series 9: T2 · axial · 1.0mm · 1.04mm/px · 1 of 72 slices shown (3 of 3)]
[im 1/72]
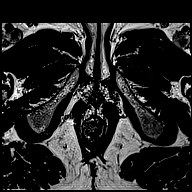

[Series 10: T1 · axial · 3.0mm · 1.15mm/px · 1 of 28 slices shown (1 of 49)]
[im 1/28]
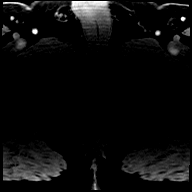

[Series 11: T1 · axial · 3.0mm · 1.15mm/px · 1 of 28 slices shown (2 of 49)]
[im 1/28]
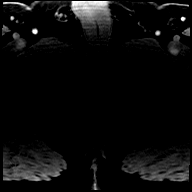

[Series 12: T1 · axial · 3.0mm · 1.15mm/px · 1 of 27 slices shown (3 of 49)]
[im 1/27]
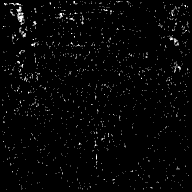

[Series 13: T1 · axial · 3.0mm · 1.15mm/px · 1 of 28 slices shown (4 of 49)]
[im 1/28]
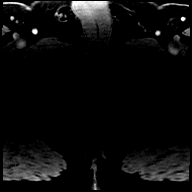

[Series 14: T1 · axial · 3.0mm · 1.15mm/px · 1 of 28 slices shown (5 of 49)]
[im 1/28]
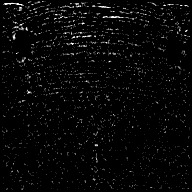

[Series 15: T1 · axial · 3.0mm · 1.15mm/px · 1 of 28 slices shown (6 of 49)]
[im 1/28]
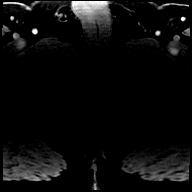

[Series 16: T1 · axial · 3.0mm · 1.15mm/px · 1 of 28 slices shown (7 of 49)]
[im 1/28]
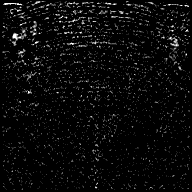

[Series 17: T1 · axial · 3.0mm · 1.15mm/px · 1 of 28 slices shown (8 of 49)]
[im 1/28]
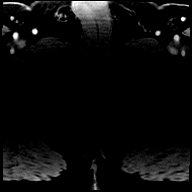

[Series 18: T1 · axial · 3.0mm · 1.15mm/px · 1 of 28 slices shown (9 of 49)]
[im 1/28]
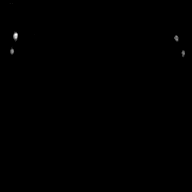

[Series 19: T1 · axial · 3.0mm · 1.15mm/px · 1 of 28 slices shown (10 of 49)]
[im 1/28]
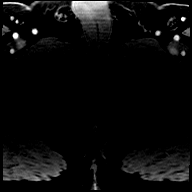

[Series 20: T1 · axial · 3.0mm · 1.15mm/px · 1 of 28 slices shown (11 of 49)]
[im 1/28]
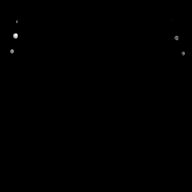

[Series 21: T1 · axial · 3.0mm · 1.15mm/px · 1 of 28 slices shown (12 of 49)]
[im 1/28]
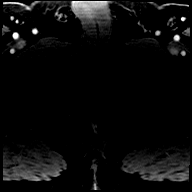

[Series 22: T1 · axial · 3.0mm · 1.15mm/px · 1 of 28 slices shown (13 of 49)]
[im 1/28]
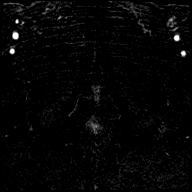

[Series 23: T1 · axial · 3.0mm · 1.15mm/px · 1 of 28 slices shown (14 of 49)]
[im 1/28]
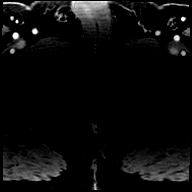

[Series 24: T1 · axial · 3.0mm · 1.15mm/px · 1 of 28 slices shown (15 of 49)]
[im 1/28]
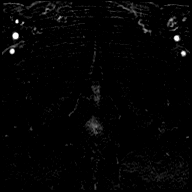

[Series 25: T1 · axial · 3.0mm · 1.15mm/px · 1 of 28 slices shown (16 of 49)]
[im 1/28]
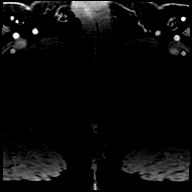

[Series 26: T1 · axial · 3.0mm · 1.15mm/px · 1 of 28 slices shown (17 of 49)]
[im 1/28]
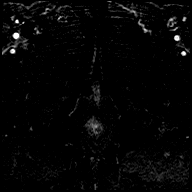

[Series 27: T1 · axial · 3.0mm · 1.15mm/px · 1 of 28 slices shown (18 of 49)]
[im 1/28]
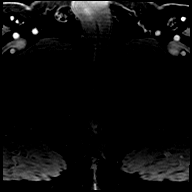

[Series 28: T1 · axial · 3.0mm · 1.15mm/px · 1 of 28 slices shown (19 of 49)]
[im 1/28]
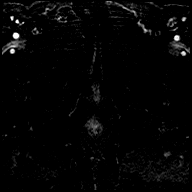

[Series 29: T1 · axial · 3.0mm · 1.15mm/px · 1 of 28 slices shown (20 of 49)]
[im 1/28]
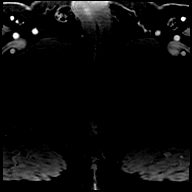

[Series 30: T1 · axial · 3.0mm · 1.15mm/px · 1 of 28 slices shown (21 of 49)]
[im 1/28]
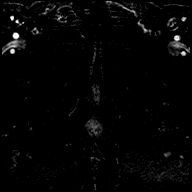

[Series 31: T1 · axial · 3.0mm · 1.15mm/px · 1 of 28 slices shown (22 of 49)]
[im 1/28]
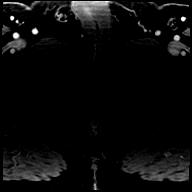

[Series 32: T1 · axial · 3.0mm · 1.15mm/px · 1 of 28 slices shown (23 of 49)]
[im 1/28]
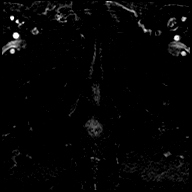

[Series 33: T1 · axial · 3.0mm · 1.15mm/px · 1 of 28 slices shown (24 of 49)]
[im 1/28]
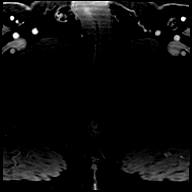

[Series 34: T1 · axial · 3.0mm · 1.15mm/px · 1 of 28 slices shown (25 of 49)]
[im 1/28]
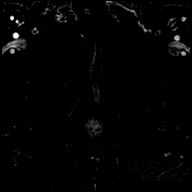

[Series 35: T1 · axial · 3.0mm · 1.15mm/px · 1 of 28 slices shown (26 of 49)]
[im 1/28]
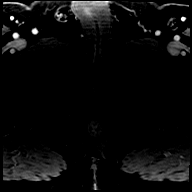

[Series 36: T1 · axial · 3.0mm · 1.15mm/px · 1 of 28 slices shown (27 of 49)]
[im 1/28]
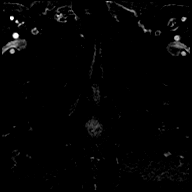

[Series 37: T1 · axial · 3.0mm · 1.15mm/px · 1 of 28 slices shown (28 of 49)]
[im 1/28]
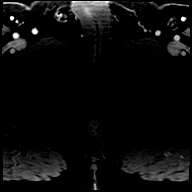

[Series 38: T1 · axial · 3.0mm · 1.15mm/px · 1 of 28 slices shown (29 of 49)]
[im 1/28]
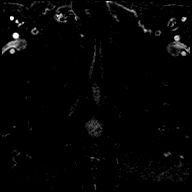

[Series 39: T1 · axial · 3.0mm · 1.15mm/px · 1 of 28 slices shown (30 of 49)]
[im 1/28]
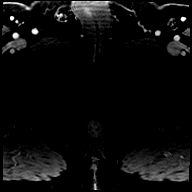

[Series 40: T1 · axial · 3.0mm · 1.15mm/px · 1 of 28 slices shown (31 of 49)]
[im 1/28]
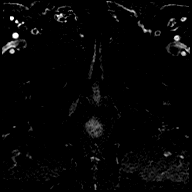

[Series 41: T1 · axial · 3.0mm · 1.15mm/px · 1 of 28 slices shown (32 of 49)]
[im 1/28]
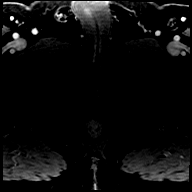

[Series 42: T1 · axial · 3.0mm · 1.15mm/px · 1 of 28 slices shown (33 of 49)]
[im 1/28]
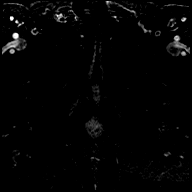

[Series 43: T1 · axial · 3.0mm · 1.15mm/px · 1 of 28 slices shown (34 of 49)]
[im 1/28]
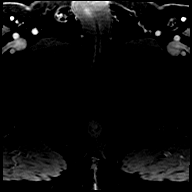

[Series 44: T1 · axial · 3.0mm · 1.15mm/px · 1 of 28 slices shown (35 of 49)]
[im 1/28]
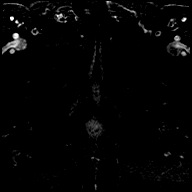

[Series 45: T1 · axial · 3.0mm · 1.15mm/px · 1 of 28 slices shown (36 of 49)]
[im 1/28]
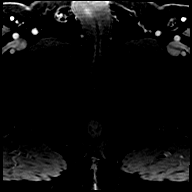

[Series 46: T1 · axial · 3.0mm · 1.15mm/px · 1 of 28 slices shown (37 of 49)]
[im 1/28]
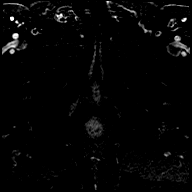

[Series 47: T1 · axial · 3.0mm · 1.15mm/px · 1 of 28 slices shown (38 of 49)]
[im 1/28]
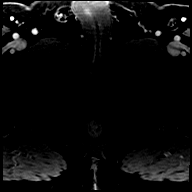

[Series 48: T1 · axial · 3.0mm · 1.15mm/px · 1 of 28 slices shown (39 of 49)]
[im 1/28]
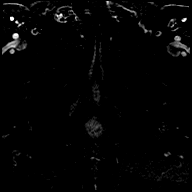

[Series 49: T1 · axial · 3.0mm · 1.15mm/px · 1 of 28 slices shown (40 of 49)]
[im 1/28]
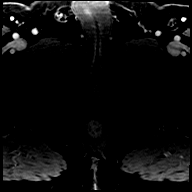

[Series 50: T1 · axial · 3.0mm · 1.15mm/px · 1 of 28 slices shown (41 of 49)]
[im 1/28]
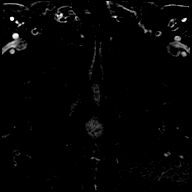

[Series 51: T1 · axial · 3.0mm · 1.15mm/px · 1 of 28 slices shown (42 of 49)]
[im 1/28]
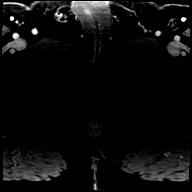

[Series 52: T1 · axial · 3.0mm · 1.15mm/px · 1 of 28 slices shown (43 of 49)]
[im 1/28]
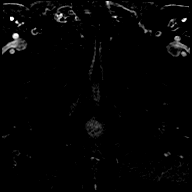

[Series 53: T1 · axial · 3.0mm · 1.15mm/px · 1 of 28 slices shown (44 of 49)]
[im 1/28]
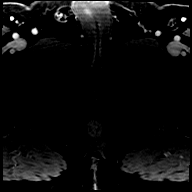

[Series 54: T1 · axial · 3.0mm · 1.15mm/px · 1 of 28 slices shown (45 of 49)]
[im 1/28]
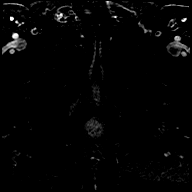

[Series 55: T1 · axial · 3.0mm · 1.15mm/px · 1 of 28 slices shown (46 of 49)]
[im 1/28]
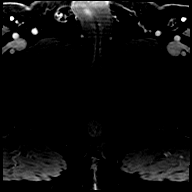

[Series 56: T1 · axial · 3.0mm · 1.15mm/px · 1 of 28 slices shown (47 of 49)]
[im 1/28]
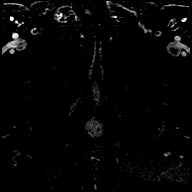

[Series 57: T1 · axial · 3.0mm · 1.15mm/px · 1 of 28 slices shown (48 of 49)]
[im 1/28]
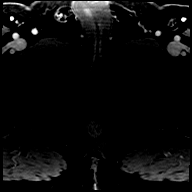

[Series 58: T1 · axial · 3.0mm · 1.15mm/px · 1 of 28 slices shown (49 of 49)]
[im 1/28]
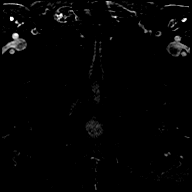

[56 of 56 positions shown; findings below may reference images not displayed]

FINDINGS: Prostate:

Peripheral zone: Areas of wedge-shaped and linear T2 hypointensity
throughout the peripheral zone. No focal area of restricted
diffusion to suggest high-risk lesion in the peripheral zone

Transitional zone: Area of moderate T2 hypointensity with mild
heterogeneity in the anterior RIGHT transitional zone near the apex
showing restricted diffusion, baseline T2 characteristics PIRADS
category 3, elevated to PIRADS category 4 based on diffusion
properties (image 19, series 4) 1 cm area in the anterior RIGHT
gland inferior aspect showing restricted diffusion on image 20 of
series 7.

Area of low signal in a BPH nodule showing restricted diffusion more
than other areas of the transitional zone aside from the focus
outlined above, dark area on ADC and corresponding diffusion
abnormality on high B value diffusion (image 16, series 4
corresponding to image 16 of series 7 and 8) RIGHT anterior
paramidline gland in the mid gland. PIRADS category 3

Volume: 43 cc

Transcapsular spread:  Absent

Seminal vesicle involvement: Absent

Neurovascular bundle involvement: Absent

Pelvic adenopathy: Are absent

Bone metastasis: Absent

Other findings: None
IMPRESSION: 1. PIRADS category 4 lesion in the anterior RIGHT transitional zone
near the apex.
2. PIRADS category 3 lesion in the anterior RIGHT paramidline mid
gland of the transitional zone. Potentially a stroma rich BPH nodule
but with diffusion properties greater than other areas of the
transitional zone and with signal on high B value images.

## 2020-08-26 MED ORDER — GADOBUTROL 1 MMOL/ML IV SOLN
10.0000 mL | Freq: Once | INTRAVENOUS | Status: AC | PRN
  Administered 2020-08-26: 10 mL via INTRAVENOUS

## 2020-08-26 NOTE — Progress Notes (Signed)
08/27/2020 9:25 AM   Ronald Shah 06/26/1961 [id number]  Referring provider: Birdie Sons, [address] [place],  [place] [address]  Chief Complaint  Patient presents with  . Follow-up    24mth follow-up    HPI: 59 year old male with low risk prostate cancer on active surveillance who returns today for 25-month follow-up.  He returns today following prostate MRI to discuss results.  He is noted to have a rising PSA recently up to 4.6 on 04/2019.  He underwent prostate biopsy on 07/22/2019 which was uncomplicated. 12 core biopsy revealed 1 of 3 cores with Gleason 3+3, 10% at the right lateral apex. There is also a suspicious area at the right lateral mid but not diagnostic for prostate cancer. There is other areas of acute and chronic inflammation within the specimen.  TRUS volume61.2 cc.  Previously tried Flomax primarily for his nighttime urinary symptoms which failed to improve.  He is never taking any medications.  These symptoms are stable, unchanged.  He has a slow stream particularly at nighttime but otherwise minimal daytime symptoms.  PSA today is pending   MRI shows 2 lesion, PIRADS category 4 lesion in the anterior RIGHT transitional zone near the apex.   PSA trend: 3.0 07/06/15 3.9 07/18/16 3.9 09/22/16 3.4 03/26/18 4.6 04/21/19 4.5 05/23/19 4.8 07/01/19  3.49 02/09/20  Pending today   PMH: Past Medical History:  Diagnosis Date  . Diabetes mellitus without complication ([place])   . History of chicken pox   . Hyperlipidemia     Surgical History: No past surgical history on file.  Home Medications:  Allergies as of 08/27/2020   No Known Allergies     Medication List       Accurate as of August 27, 2020  9:25 AM. If you have any questions, ask your nurse or doctor.        glipiZIDE 5 MG tablet Commonly known as: GLUCOTROL TAKE 1 TABLET BY MOUTH TWICE DAILY   metFORMIN 500 MG 24 hr tablet Commonly known as:  GLUCOPHAGE-XR Take 2 tablets (1,000 mg total) by mouth 2 (two) times daily. Please schedule office visit before any future refills   pravastatin 40 MG tablet Commonly known as: PRAVACHOL TAKE 1 TABLET BY MOUTH EVERY DAY       Allergies: No Known Allergies  Family History: Family History  Problem Relation Age of Onset  . Diabetes Mother     Social History:  reports that he has been smoking e-cigarettes. He has never used smokeless tobacco. He reports current alcohol use. He reports that he does not use drugs.   Physical Exam: BP 120/79   Pulse 71   Ht 6' (1.829 m)   Wt 262 lb (118.8 kg)   BMI 35.53 kg/m   Constitutional:  Alert and oriented, No acute distress. HEENT: [place] AT, moist mucus membranes.  Trachea midline, no masses. Cardiovascular: No clubbing, cyanosis, or edema. Respiratory: Normal respiratory effort, no increased work of breathing. Rectal: Normal sphincter tone.  60 cc prostate, firm bilaterally without nodularity. Skin: No rashes, bruises or suspicious lesions. Neurologic: Grossly intact, no focal deficits, moving all 4 extremities. Psychiatric: Normal mood and affect.   Narrative & Impression  CLINICAL DATA:  Prostate cancer surveillance. History of Gleason 6 disease on prior biopsy in this 59 year old male  EXAM: MR PROSTATE WITHOUT AND WITH CONTRAST  TECHNIQUE: Multiplanar multisequence MRI images were obtained of the pelvis centered about the prostate. Pre and post contrast images were  obtained.  CONTRAST:  25mL GADAVIST GADOBUTROL 1 MMOL/ML IV SOLN  COMPARISON:  None  FINDINGS: Prostate:  Peripheral zone: Areas of wedge-shaped and linear T2 hypointensity throughout the peripheral zone. No focal area of restricted diffusion to suggest high-risk lesion in the peripheral zone  Transitional zone: Area of moderate T2 hypointensity with mild heterogeneity in the anterior RIGHT transitional zone near the apex showing restricted  diffusion, baseline T2 characteristics PIRADS category 3, elevated to PIRADS category 4 based on diffusion properties (image 19, series 4) 1 cm area in the anterior RIGHT gland inferior aspect showing restricted diffusion on image 20 of series 7.  Area of low signal in a BPH nodule showing restricted diffusion more than other areas of the transitional zone aside from the focus outlined above, dark area on ADC and corresponding diffusion abnormality on high B value diffusion (image 16, series 4 corresponding to image 16 of series 7 and 8) RIGHT anterior paramidline gland in the mid gland. PIRADS category 3  Volume: 43 cc  Transcapsular spread:  Absent  Seminal vesicle involvement: Absent  Neurovascular bundle involvement: Absent  Pelvic adenopathy: Are absent  Bone metastasis: Absent  Other findings: None  IMPRESSION: 1. PIRADS category 4 lesion in the anterior RIGHT transitional zone near the apex. 2. PIRADS category 3 lesion in the anterior RIGHT paramidline mid gland of the transitional zone. Potentially a stroma rich BPH nodule but with diffusion properties greater than other areas of the transitional zone and with signal on high B value images.   Electronically Signed   By: Zetta Bills M.D.   On: 08/27/2020 07:46   MRI personally reviewed, agree with radiologic interpretation.  Assessment & Plan:    1. Prostate cancer [facility]) Very low risk prostate cancer and active surveillance  Rectal exam today is stable  MRI was personally reviewed today with the patient.  There is an area of PI-RADS 4 at the right transition zone near the apex which is where his biopsy-proven disease is, previously 3+3  Unfortunately, he did not get his PSA today so we cannot make any correlation there.  Discussed the sensitivity and specificity of MRI as it relates to PI-RADS 4 lesions.  If his PSA is rising, he is willing to consider fusion biopsy.  If his PSA is stable, I  believe is reasonable to continue active surveillance and use this MRI for comparison if his PSA should rise.  He is agreeable this plan.  2. BPH associated with nocturia Symptoms stable, declines further treatment    Follow-up in 6 months for PSA, will call with today's PSA results and recommendations   [name], MD  [facility] [address] [place],  [address] [phone]  I spent 30 total minutes on the day of the encounter including pre-visit review of the medical record, face-to-face time with the patient, and post visit ordering of labs/imaging/tests.

## 2020-08-27 ENCOUNTER — Other Ambulatory Visit
Admission: RE | Admit: 2020-08-27 | Discharge: 2020-08-27 | Disposition: A | Discharge status: Home / Self Care | Payer: Managed Care, Other (non HMO) | Attending: Urology | Admitting: Urology

## 2020-08-27 ENCOUNTER — Encounter: Payer: Self-pay | Admitting: Urology

## 2020-08-27 ENCOUNTER — Ambulatory Visit: Payer: Managed Care, Other (non HMO) | Admitting: Urology

## 2020-08-27 VITALS — BP 120/79 | HR 71 | Ht 72.0 in | Wt 262.0 lb

## 2020-08-27 DIAGNOSIS — C61 Malignant neoplasm of prostate: Secondary | ICD-10-CM | POA: Insufficient documentation

## 2020-08-27 DIAGNOSIS — N401 Enlarged prostate with lower urinary tract symptoms: Secondary | ICD-10-CM | POA: Diagnosis not present

## 2020-08-27 DIAGNOSIS — R351 Nocturia: Secondary | ICD-10-CM | POA: Insufficient documentation

## 2020-08-27 LAB — PSA: Prostatic Specific Antigen: 4.86 ng/mL — ABNORMAL HIGH (ref 0.00–4.00)

## 2020-09-27 ENCOUNTER — Other Ambulatory Visit: Payer: Self-pay | Admitting: Family Medicine

## 2020-09-27 ENCOUNTER — Telehealth: Payer: Self-pay | Admitting: Family Medicine

## 2020-09-27 MED ORDER — METFORMIN HCL ER 500 MG PO TB24: 1000.0000 mg | ORAL_TABLET | Freq: Two times a day (BID) | ORAL | 0 refills | Status: DC

## 2020-09-27 MED ORDER — GLIPIZIDE 5 MG PO TABS: 5.0000 mg | ORAL_TABLET | Freq: Two times a day (BID) | ORAL | 0 refills | Status: DC

## 2020-09-27 MED ORDER — PRAVASTATIN SODIUM 40 MG PO TABS: 40.0000 mg | ORAL_TABLET | Freq: Every day | ORAL | 0 refills | Status: DC

## 2020-09-27 NOTE — Telephone Encounter (Signed)
Copied from [place] [id number]. Topic: Quick Communication - Rx Refill/Question >> Sep 27, 2020 11:54 AM Leward Quan A wrote: Medication: pravastatin (PRAVACHOL) 40 MG tablet, metFORMIN (GLUCOPHAGE-XR) 500 MG 24 hr tablet, glipiZIDE (GLUCOTROL) 5 MG tablet   Has the patient contacted their pharmacy? Yes.   (Agent: If no, request that the patient contact the pharmacy for the refill.) (Agent: If yes, when and what did the pharmacy advise?)  Preferred Pharmacy (with phone number or street name): [place], [place] - [place]  Phone:  [phone] Fax:  [phone]     Agent: Please be advised that RX refills may take up to 3 business days. We ask that you follow-up with your pharmacy.

## 2020-09-27 NOTE — Telephone Encounter (Signed)
Patient called to ask for a courtesy refill for his medications, since his appt. Isn't until 2/15 and he will run out of medications before then.  Please call patient to discuss at [phone]

## 2020-09-28 NOTE — Telephone Encounter (Signed)
Medications refilled on 09/27/20.

## 2020-10-12 NOTE — Patient Instructions (Signed)
.   Please review the attached list of medications and notify my office if there are any errors.   . Please bring all of your medications to every appointment so we can make sure that our medication list is the same as yours.   

## 2020-10-14 ENCOUNTER — Other Ambulatory Visit: Payer: Self-pay | Admitting: Family Medicine

## 2020-10-14 NOTE — Telephone Encounter (Signed)
Refill provided. Patient has OV scheduled on 11/02/20.

## 2020-10-20 ENCOUNTER — Encounter: Payer: Managed Care, Other (non HMO) | Admitting: Family Medicine

## 2020-10-29 ENCOUNTER — Other Ambulatory Visit: Payer: Self-pay | Admitting: Family Medicine

## 2020-10-29 NOTE — Telephone Encounter (Signed)
Courtesy refill. Future visit in 4 days. Last labs 04/21/2019

## 2020-11-02 ENCOUNTER — Ambulatory Visit (INDEPENDENT_AMBULATORY_CARE_PROVIDER_SITE_OTHER): Payer: Managed Care, Other (non HMO) | Admitting: Family Medicine

## 2020-11-02 ENCOUNTER — Encounter: Payer: Self-pay | Admitting: Family Medicine

## 2020-11-02 ENCOUNTER — Other Ambulatory Visit: Payer: Self-pay

## 2020-11-02 VITALS — BP 115/78 | HR 75 | Temp 97.3°F | Ht 72.0 in | Wt 262.0 lb

## 2020-11-02 DIAGNOSIS — Z23 Encounter for immunization: Secondary | ICD-10-CM

## 2020-11-02 DIAGNOSIS — E785 Hyperlipidemia, unspecified: Secondary | ICD-10-CM | POA: Diagnosis not present

## 2020-11-02 DIAGNOSIS — E1121 Type 2 diabetes mellitus with diabetic nephropathy: Secondary | ICD-10-CM | POA: Diagnosis not present

## 2020-11-02 DIAGNOSIS — Z0000 Encounter for general adult medical examination without abnormal findings: Secondary | ICD-10-CM

## 2020-11-02 LAB — POCT GLYCOSYLATED HEMOGLOBIN (HGB A1C)
Estimated Average Glucose: 203
Hemoglobin A1C: 8.7 % — AB (ref 4.0–5.6)

## 2020-11-02 NOTE — Progress Notes (Signed)
Complete physical exam   Patient: Ronald Shah   DOB: 1961-05-29   60 y.o. Male  MRN: [id number] Visit Date: 11/02/2020  Today's healthcare provider: Lelon Huh, MD   Chief Complaint  Patient presents with  . Annual Exam   Subjective    Ronald Shah is a 60 y.o. male who presents today for a complete physical exam.  He reports consuming a general diet. The patient has a physically strenuous job, but has no regular exercise apart from work.  He generally feels well. He reports sleeping well. He does not have additional problems to discuss today.  HPI  Diabetes Mellitus Type II, Follow-up  Lab Results  Component Value Date   HGBA1C 6.8 (A) 04/21/2019   HGBA1C 7.1 (A) 01/15/2019   HGBA1C 11.4 (A) 11/13/2018   Wt Readings from Last 3 Encounters:  11/02/20 262 lb (118.8 kg)  08/27/20 262 lb (118.8 kg)  02/27/20 261 lb (118.4 kg)   Last seen for diabetes 1.5 years ago.  Management since then includes continue current medications. He reports excellent compliance with treatment. He is not having side effects.  Symptoms: No fatigue No foot ulcerations  No appetite changes No nausea  No paresthesia of the feet  No polydipsia  No polyuria No visual disturbances   No vomiting     Home blood sugar records: fasting range: 140 something last time he did it  Episodes of hypoglycemia? No    Current insulin regiment:  Most Recent Eye Exam: over a year ago Current exercise: none Current diet habits: well balanced  Pertinent Labs: Lab Results  Component Value Date   CHOL 121 04/21/2019   HDL 48 04/21/2019   LDLCALC 58 04/21/2019   LDLDIRECT 72 03/26/2018   TRIG 75 04/21/2019   CHOLHDL 2.5 04/21/2019   Lab Results  Component Value Date   NA 140 04/21/2019   K 4.4 04/21/2019   CREATININE 1.04 04/21/2019   GFRNONAA 79 04/21/2019   GFRAA 91 04/21/2019   GLUCOSE 121 (H) 04/21/2019      --------------------------------------------------------------------------------------------------- Lipid/Cholesterol, Follow-up  Last lipid panel Other pertinent labs  Lab Results  Component Value Date   CHOL 121 04/21/2019   HDL 48 04/21/2019   LDLCALC 58 04/21/2019   LDLDIRECT 72 03/26/2018   TRIG 75 04/21/2019   CHOLHDL 2.5 04/21/2019   Lab Results  Component Value Date   ALT 20 04/21/2019   AST 14 04/21/2019     He was last seen for this 1.5 years ago.  Management since that visit includes continue current statin.  He reports excellent compliance with treatment. He is not having side effects.   Symptoms: No chest pain No chest pressure/discomfort  No dyspnea No lower extremity edema  No numbness or tingling of extremity No orthopnea  No palpitations No paroxysmal nocturnal dyspnea  No speech difficulty No syncope   Current diet: well balanced Current exercise: none  The ASCVD Risk score ([place]., et al., 2013) failed to calculate for the following reasons:   The valid total cholesterol range is 130 to 320 mg/dL  ---------------------------------------------------------------------------------------------------   Past Medical History:  Diagnosis Date  . History of chicken pox    No past surgical history on file. Social History   Socioeconomic History  . Marital status: Married    Spouse name: Not on file  . Number of children: 5  . Years of education: Not on file  . Highest education level: Not on file  Occupational History  . Occupation: [employer]  Tobacco Use  . Smoking status: Current Some Day Smoker    Types: E-cigarettes  . Smokeless tobacco: Never Used  . Tobacco comment: smoked 1 ppd for 20-25 yrs quit cigarettes in 2005  Substance and Sexual Activity  . Alcohol use: Yes    Comment: drinks 2-3 times a week  . Drug use: No  . Sexual activity: Not on file  Other Topics Concern  . Not on file  Social History Narrative  . Not on file    Social Determinants of Health   Financial Resource Strain: Not on file  Food Insecurity: Not on file  Transportation Needs: Not on file  Physical Activity: Not on file  Stress: Not on file  Social Connections: Not on file  Intimate Partner Violence: Not on file   Family Status  Relation Name Status  . Mother  Alive  . Father  Deceased at age 75       Died from an MI   Family History  Problem Relation Age of Onset  . Diabetes Mother    No Known Allergies  Patient Care Team: Birdie Sons, MD as PCP - General (Family Medicine)   Medications: Outpatient Medications Prior to Visit  Medication Sig  . glipiZIDE (GLUCOTROL) 5 MG tablet TAKE ONE TABLET BY MOUTH TWICE DAILY   . metFORMIN (GLUCOPHAGE-XR) 500 MG 24 hr tablet TAKE TWO TABLETS BY MOUTH TWICE DAILY. PLEASE SCHEDULE FOR OFFICE USE VISIT FORFUTURE REFILLS.  Marland Kitchen pravastatin (PRAVACHOL) 40 MG tablet TAKE ONE TABLET BY MOUTH EVERY DAY    No facility-administered medications prior to visit.    Review of Systems  Genitourinary: Positive for difficulty urinating.       Prostate related according to pt      Objective    BP 115/78 (BP Location: Right Arm, Patient Position: Sitting, Cuff Size: Large)   Pulse 75   Temp (!) 97.3 F (36.3 C) (Temporal)   Ht 6' (1.829 m)   Wt 262 lb (118.8 kg)   BMI 35.53 kg/m    Physical Exam    General Appearance:    Obese male. Alert, cooperative, in no acute distress, appears stated age  Head:    Normocephalic, without obvious abnormality, atraumatic  Eyes:    PERRL, conjunctiva/corneas clear, EOM's intact, fundi    benign, both eyes       Ears:    Normal TM's and external ear canals, both ears  Neck:   Supple, symmetrical, trachea midline, no adenopathy;       thyroid:  No enlargement/tenderness/nodules; no carotid   bruit or JVD  Back:     Symmetric, no curvature, ROM normal, no CVA tenderness  Lungs:     Clear to auscultation bilaterally, respirations unlabored  Chest  wall:    No tenderness or deformity  Heart:    Normal heart rate. Normal rhythm. No murmurs, rubs, or gallops.  S1 and S2 normal  Abdomen:     Soft, non-tender, bowel sounds active all four quadrants,    no masses, no organomegaly  Genitalia:    deferred  Rectal:    deferred  Extremities:   All extremities are intact. No cyanosis or edema  Pulses:   2+ and symmetric all extremities  Skin:   Skin color, texture, turgor normal, no rashes or lesions  Lymph nodes:   Cervical, supraclavicular, and axillary nodes normal  Neurologic:   CNII-XII intact. Normal strength, sensation and reflexes  throughout     Last depression screening scores PHQ 2/9 Scores 07/09/2018 07/10/2017 06/09/2015  PHQ - 2 Score 0 0 0  PHQ- 9 Score 0 - 0   Last fall risk screening Fall Risk  07/10/2017  Falls in the past year? No   Last Audit-C alcohol use screening Alcohol Use Disorder Test (AUDIT) 07/09/2018  1. How often do you have a drink containing alcohol? 3  2. How many drinks containing alcohol do you have on a typical day when you are drinking? 1  3. How often do you have six or more drinks on one occasion? 1  AUDIT-C Score 5  4. How often during the last year have you found that you were not able to stop drinking once you had started? 0  5. How often during the last year have you failed to do what was normally expected from you because of drinking? 0  6. How often during the last year have you needed a first drink in the morning to get yourself going after a heavy drinking session? 0  7. How often during the last year have you had a feeling of guilt of remorse after drinking? 0  8. How often during the last year have you been unable to remember what happened the night before because you had been drinking? 0  9. Have you or someone else been injured as a result of your drinking? 0  10. Has a relative or friend or a doctor or another health worker been concerned about your drinking or suggested you cut  down? 0  Alcohol Use Disorder Identification Test Final Score (AUDIT) 5  Alcohol Brief Interventions/Follow-up AUDIT Score <7 follow-up not indicated   A score of 3 or more in women, and 4 or more in men indicates increased risk for alcohol abuse, EXCEPT if all of the points are from question 1   No results found for any visits on 11/02/20.  Assessment & Plan    Routine Health Maintenance and Physical Exam  Exercise Activities and Dietary recommendations Goals   None     Immunization History  Administered Date(s) Administered  . Hepatitis B 06/11/2012, 07/12/2012, 12/16/2012  . Pneumococcal Polysaccharide-23 03/08/2012  . Tdap 06/01/2008    Health Maintenance  Topic Date Due  . FOOT EXAM  06/08/2016  . HEMOGLOBIN A1C  10/22/2019  . URINE MICROALBUMIN  11/14/2019  . OPHTHALMOLOGY EXAM  06/29/2020  . COVID-19 Vaccine (1) 11/18/2020 (Originally 05/10/1972)  . INFLUENZA VACCINE  12/16/2020 (Originally 04/18/2020)  . COLONOSCOPY (Pts 45-30yrs Insurance coverage will need to be confirmed)  06/24/2022  . TETANUS/TDAP  11/02/2030  . PNEUMOCOCCAL POLYSACCHARIDE VACCINE AGE 21-64 HIGH RISK  Completed  . Hepatitis C Screening  Completed  . HIV Screening  Completed    Discussed health benefits of physical activity, and encouraged him to engage in regular exercise appropriate for his age and condition.  1. Annual physical exam   2. Diabetes mellitus with nephropathy (HCC) A1c now over 8%. See how labs look. Anticipate increasing glipizide.   3. Morbid obesity ([place])   4. Hyperlipidemia, unspecified hyperlipidemia type He is tolerating pravastatin well with no adverse effects.   - CBC - Comprehensive metabolic panel - Lipid panel  5. Need for tetanus, diphtheria, and acellular pertussis (Tdap) vaccine in patient of adolescent age or older  - Administer Tetanus-diphtheria-acellular pertussis (Tdap) vaccine   He declined flu vaccine and shingles vaccine.          Lelon Huh,  MD  [facility] [phone] (phone) [phone] (fax)  [place]

## 2020-11-03 ENCOUNTER — Other Ambulatory Visit: Payer: Self-pay

## 2020-11-03 LAB — COMPREHENSIVE METABOLIC PANEL
ALT: 23 IU/L (ref 0–44)
AST: 15 IU/L (ref 0–40)
Albumin/Globulin Ratio: 1.4 (ref 1.2–2.2)
Albumin: 4.3 g/dL (ref 3.8–4.9)
Alkaline Phosphatase: 76 IU/L (ref 44–121)
BUN/Creatinine Ratio: 17 (ref 10–24)
BUN: 22 mg/dL (ref 8–27)
Bilirubin Total: 0.4 mg/dL (ref 0.0–1.2)
CO2: 25 mmol/L (ref 20–29)
Calcium: 9.6 mg/dL (ref 8.6–10.2)
Chloride: 99 mmol/L (ref 96–106)
Creatinine, Ser: 1.29 mg/dL — ABNORMAL HIGH (ref 0.76–1.27)
GFR calc Af Amer: 69 mL/min/{1.73_m2} (ref >59)
GFR calc non Af Amer: 60 mL/min/{1.73_m2} (ref >59)
Globulin, Total: 3 g/dL (ref 1.5–4.5)
Glucose: 252 mg/dL — ABNORMAL HIGH (ref 65–99)
Potassium: 4.5 mmol/L (ref 3.5–5.2)
Sodium: 138 mmol/L (ref 134–144)
Total Protein: 7.3 g/dL (ref 6.0–8.5)

## 2020-11-03 LAB — LIPID PANEL
Chol/HDL Ratio: 4 ratio (ref 0.0–5.0)
Cholesterol, Total: 185 mg/dL (ref 100–199)
HDL: 46 mg/dL (ref >39)
LDL Chol Calc (NIH): 105 mg/dL — ABNORMAL HIGH (ref 0–99)
Triglycerides: 196 mg/dL — ABNORMAL HIGH (ref 0–149)
VLDL Cholesterol Cal: 34 mg/dL (ref 5–40)

## 2020-11-03 LAB — CBC
Hematocrit: 41.9 % (ref 37.5–51.0)
Hemoglobin: 14.1 g/dL (ref 13.0–17.7)
MCH: 30.5 pg (ref 26.6–33.0)
MCHC: 33.7 g/dL (ref 31.5–35.7)
MCV: 91 fL (ref 79–97)
Platelets: 294 10*3/uL (ref 150–450)
RBC: 4.62 x10E6/uL (ref 4.14–5.80)
RDW: 12.5 % (ref 11.6–15.4)
WBC: 8.9 10*3/uL (ref 3.4–10.8)

## 2020-11-03 MED ORDER — GLIPIZIDE 10 MG PO TABS: 10.0000 mg | ORAL_TABLET | Freq: Every day | ORAL | 3 refills | Status: DC

## 2020-12-02 ENCOUNTER — Other Ambulatory Visit: Payer: Self-pay | Admitting: Family Medicine

## 2021-01-07 ENCOUNTER — Telehealth: Payer: Self-pay

## 2021-01-07 MED ORDER — GLIPIZIDE 10 MG PO TABS: 10.0000 mg | ORAL_TABLET | Freq: Every day | ORAL | 1 refills | Status: DC

## 2021-01-07 MED ORDER — METFORMIN HCL ER 500 MG PO TB24: 1000.0000 mg | ORAL_TABLET | Freq: Two times a day (BID) | ORAL | 1 refills | Status: DC

## 2021-01-07 NOTE — Telephone Encounter (Signed)
[facility] faxed refill request for the following medications:  glipiZIDE (GLUCOTROL) 10 MG tablet  metFORMIN (GLUCOPHAGE-XR) 500 MG 24 hr tablet   Please advise.

## 2021-02-11 ENCOUNTER — Encounter: Payer: Self-pay | Admitting: Family Medicine

## 2021-02-11 ENCOUNTER — Ambulatory Visit: Payer: Managed Care, Other (non HMO) | Admitting: Family Medicine

## 2021-02-11 ENCOUNTER — Other Ambulatory Visit: Payer: Self-pay

## 2021-02-11 VITALS — BP 122/79 | HR 69 | Temp 98.4°F | Resp 18 | Ht 72.0 in | Wt 262.8 lb

## 2021-02-11 DIAGNOSIS — E1121 Type 2 diabetes mellitus with diabetic nephropathy: Secondary | ICD-10-CM

## 2021-02-11 LAB — POCT UA - MICROALBUMIN: Microalbumin Ur, POC: NEGATIVE mg/L

## 2021-02-11 LAB — POCT GLYCOSYLATED HEMOGLOBIN (HGB A1C)
Est. average glucose Bld gHb Est-mCnc: 223
Hemoglobin A1C: 9.4 % — AB (ref 4.0–5.6)

## 2021-02-11 MED ORDER — PRAVASTATIN SODIUM 40 MG PO TABS: 40.0000 mg | ORAL_TABLET | Freq: Every evening | ORAL | 4 refills | Status: DC

## 2021-02-11 MED ORDER — RYBELSUS 3 MG PO TABS
3.0000 mg | ORAL_TABLET | Freq: Every day | ORAL | 0 refills | Status: DC
Start: 2021-02-11 — End: 2021-02-11

## 2021-02-11 NOTE — Patient Instructions (Signed)
.   Please review the attached list of medications and notify my office if there are any errors.   . If your A1c is still high in the fall, then we'll need to try a new medication such as Rybelsus, Ozempic, or Trulicity

## 2021-02-11 NOTE — Progress Notes (Signed)
Established patient visit   Patient: Ronald Shah   DOB: 06/24/1961   60 y.o. Male  MRN: [id number] Visit Date: 02/11/2021  Today's healthcare provider: Lelon Huh, MD   Chief Complaint  Patient presents with  . Diabetes   Subjective    HPI  Diabetes Mellitus Type II, Follow-up  Lab Results  Component Value Date   HGBA1C 9.4 (A) 02/11/2021   HGBA1C 8.7 (A) 11/02/2020   HGBA1C 6.8 (A) 04/21/2019   Wt Readings from Last 3 Encounters:  02/11/21 262 lb 12.8 oz (119.2 kg)  11/02/20 262 lb (118.8 kg)  08/27/20 262 lb (118.8 kg)   Last seen for diabetes 3 months ago.  Management since then includes increasing glipizide to 10mg  once a day. He reports fair compliance with treatment. Patient ran out of his medication for several weeks, but is now back on them. He also frequently forgets to take his morning medications.  He is not having side effects.  Symptoms: No fatigue No foot ulcerations  No appetite changes No nausea  No paresthesia of the feet  No polydipsia  No polyuria No visual disturbances   No vomiting     Home blood sugar records: fasting range: 180's  Episodes of hypoglycemia? No    Current insulin regiment: none Most Recent Eye Exam: >1 year ago Current exercise: none Current diet habits: in general, an "unhealthy" diet  Pertinent Labs: Lab Results  Component Value Date   CHOL 185 11/02/2020   HDL 46 11/02/2020   LDLCALC 105 (H) 11/02/2020   LDLDIRECT 72 03/26/2018   TRIG 196 (H) 11/02/2020   CHOLHDL 4.0 11/02/2020   Lab Results  Component Value Date   NA 138 11/02/2020   K 4.5 11/02/2020   CREATININE 1.29 (H) 11/02/2020   GFRNONAA 60 11/02/2020   GFRAA 69 11/02/2020   GLUCOSE 252 (H) 11/02/2020     ---------------------------------------------------------------------------------------------------     Medications: Outpatient Medications Prior to Visit  Medication Sig  . glipiZIDE (GLUCOTROL) 10 MG tablet Take 1 tablet (10 mg  total) by mouth daily before breakfast.  . metFORMIN (GLUCOPHAGE-XR) 500 MG 24 hr tablet Take 2 tablets (1,000 mg total) by mouth 2 (two) times daily.  . pravastatin (PRAVACHOL) 40 MG tablet    No facility-administered medications prior to visit.    Review of Systems     Objective    BP 122/79 (BP Location: Left Arm, Patient Position: Sitting, Cuff Size: Normal)   Pulse 69   Temp 98.4 F (36.9 C) (Temporal)   Resp 18   Ht 6' (1.829 m)   Wt 262 lb 12.8 oz (119.2 kg)   BMI 35.64 kg/m     Physical Exam   General appearance: Mildly obese male, cooperative and in no acute distress Head: Normocephalic, without obvious abnormality, atraumatic Respiratory: Respirations even and unlabored, normal respiratory rate Extremities: All extremities are intact.  Skin: Skin color, texture, turgor normal. No rashes seen  Psych: Appropriate mood and affect. Neurologic: Mental status: Alert, oriented to person, place, and time, thought content appropriate.   Results for orders placed or performed in visit on 02/11/21  POCT HgB A1C  Result Value Ref Range   Hemoglobin A1C 9.4 (A) 4.0 - 5.6 %   Est. average glucose Bld gHb Est-mCnc 223   POCT UA - Microalbumin  Result Value Ref Range   Microalbumin Ur, POC negative mg/L    Assessment & Plan     1. Diabetes mellitus with nephropathy (  [place]) Uncontrolled, partially due to frequently missing his morning medications. He plans on starting to set an alarm to remind him to take his medications. If not improved at follow up will consider adding a GLP1 agonist.       The entirety of the information documented in the History of Present Illness, Review of Systems and Physical Exam were personally obtained by me. Portions of this information were initially documented by the CMA and reviewed by me for thoroughness and accuracy.      Lelon Huh, MD  [facility] [phone] (phone) [phone] (fax)  [place]

## 2021-02-17 ENCOUNTER — Other Ambulatory Visit
Admission: RE | Admit: 2021-02-17 | Discharge: 2021-02-17 | Disposition: A | Discharge status: Home / Self Care | Payer: Managed Care, Other (non HMO) | Attending: Urology | Admitting: Urology

## 2021-02-17 ENCOUNTER — Other Ambulatory Visit: Payer: Self-pay

## 2021-02-17 DIAGNOSIS — C61 Malignant neoplasm of prostate: Secondary | ICD-10-CM | POA: Insufficient documentation

## 2021-02-17 LAB — PSA: Prostatic Specific Antigen: 4.62 ng/mL — ABNORMAL HIGH (ref 0.00–4.00)

## 2021-02-25 ENCOUNTER — Ambulatory Visit: Payer: Managed Care, Other (non HMO) | Admitting: Urology

## 2021-03-11 ENCOUNTER — Ambulatory Visit: Payer: Managed Care, Other (non HMO) | Admitting: Urology

## 2021-03-11 ENCOUNTER — Other Ambulatory Visit: Payer: Self-pay

## 2021-03-11 VITALS — BP 132/85 | HR 67 | Ht 72.0 in | Wt 250.0 lb

## 2021-03-11 DIAGNOSIS — C61 Malignant neoplasm of prostate: Secondary | ICD-10-CM

## 2021-03-11 NOTE — Progress Notes (Signed)
03/11/2021 4:02 PM   Ronald Shah 08-25-1961 [id number]  Referring provider: Birdie Sons, MD [address] [place],  [place] [address]  Chief Complaint  Patient presents with   Prostate Cancer    HPI: 60 year old male with low risk prostate cancer active surveillance returns for 8-month follow-up.  He underwent prostate biopsy on 07/22/2019 which was uncomplicated.  12 core biopsy revealed 1 of 3 cores with Gleason 3+3, 10% at the right lateral apex.  There is also a suspicious area at the right lateral mid but not diagnostic for prostate cancer.  There is other areas of acute and chronic inflammation within the specimen.  TRUS volume 61.2 cc.    Prostate MRI shows 2 lesion, PIRADS category 4 lesion in the anterior RIGHT transitional zone near the apex on 12/21.    In terms of urination, symptoms are stable and do not bother him particularly.  He gets up at nighttime to urinate when he does, stream is weak but in the daytime he does fine.  He tried Flomax which did not help.  He does monitor any other medication or intervention at this time.  PSA trend: 3.0 07/06/15 3.9 07/18/16 3.9 09/22/16 3.4 03/26/18 4.6 04/21/19 4.5 05/23/19 4.8 07/01/19 3.49 02/09/20  4.86  12/21 4.62  6/22    PMH: Past Medical History:  Diagnosis Date   History of chicken pox     Surgical History: No past surgical history on file.  Home Medications:  Allergies as of 03/11/2021   No Known Allergies      Medication List        Accurate as of March 11, 2021  4:02 PM. If you have any questions, ask your nurse or doctor.          glipiZIDE 10 MG tablet Commonly known as: GLUCOTROL Take 1 tablet (10 mg total) by mouth daily before breakfast.   metFORMIN 500 MG 24 hr tablet Commonly known as: GLUCOPHAGE-XR Take 2 tablets (1,000 mg total) by mouth 2 (two) times daily.   pravastatin 40 MG tablet Commonly known as: PRAVACHOL Take 1 tablet (40 mg total) by mouth every  evening.        Allergies: No Known Allergies  Family History: Family History  Problem Relation Age of Onset   Diabetes Mother     Social History:  reports that he has been smoking e-cigarettes. He has never used smokeless tobacco. He reports current alcohol use. He reports that he does not use drugs.   Physical Exam: BP 132/85   Pulse 67   Ht 6' (1.829 m)   Wt 250 lb (113.4 kg)   BMI 33.91 kg/m   Constitutional:  Alert and oriented, No acute distress. HEENT: [place] AT, moist mucus membranes.  Trachea midline, no masses. Cardiovascular: No clubbing, cyanosis, or edema. Respiratory: Normal respiratory effort, no increased work of breathing. Skin: No rashes, bruises or suspicious lesions. Neurologic: Grossly intact, no focal deficits, moving all 4 extremities. Psychiatric: Normal mood and affect.   Assessment & Plan:    1. Prostate cancer [facility]) Very low risk prostate cancer on active surveillance  His PSA remains essentially stable from 6 months ago which is reassuring  Rectal exam completed 6 months ago.  Please see previous office visit for discussion, low threshold to to either repeat MRI versus proceed with fusion biopsy should PSA start to rise, he is agreeable this plan.  - PSA; Future   F/u 6 momths for PSA/ DRE  [name], MD  [facility] [address] [place], [place] [address] [phone]

## 2021-05-16 ENCOUNTER — Ambulatory Visit: Payer: Self-pay | Admitting: Family Medicine

## 2021-06-10 ENCOUNTER — Encounter: Payer: Self-pay | Admitting: Family Medicine

## 2021-06-10 ENCOUNTER — Other Ambulatory Visit: Payer: Self-pay

## 2021-06-10 ENCOUNTER — Ambulatory Visit: Payer: Managed Care, Other (non HMO) | Admitting: Family Medicine

## 2021-06-10 VITALS — BP 124/82 | HR 65 | Temp 98.5°F | Ht 71.0 in | Wt 261.9 lb

## 2021-06-10 DIAGNOSIS — E1121 Type 2 diabetes mellitus with diabetic nephropathy: Secondary | ICD-10-CM | POA: Diagnosis not present

## 2021-06-10 LAB — POCT GLYCOSYLATED HEMOGLOBIN (HGB A1C): Hemoglobin A1C: 10 % — AB (ref 4.0–5.6)

## 2021-06-10 MED ORDER — RYBELSUS 3 MG PO TABS: 3.0000 mg | ORAL_TABLET | Freq: Every day | ORAL | 0 refills | Status: DC

## 2021-06-10 NOTE — Patient Instructions (Addendum)
We'll be in touch in 3-4 weeks to see how you are doing with the Rybelsus prescription. If you are tolerating it well then we'll increase the dose to 7mg  next month.

## 2021-06-10 NOTE — Progress Notes (Signed)
Established patient visit   Patient: Ronald Shah   DOB: [birth year]   60 y.o. Male  MRN: [id number] Visit Date: 06/10/2021  Today's healthcare provider: Lelon Huh, MD   Chief Complaint  Patient presents with   Follow-up   Diabetes   Subjective    HPI  Diabetes Mellitus Type II, Follow-up  Lab Results  Component Value Date   HGBA1C 10.0 (A) 06/10/2021   HGBA1C 9.4 (A) 02/11/2021   HGBA1C 8.7 (A) 11/02/2020   Wt Readings from Last 3 Encounters:  06/10/21 261 lb 14.4 oz (118.8 kg)  03/11/21 250 lb (113.4 kg)  02/11/21 262 lb 12.8 oz (119.2 kg)   Last seen for diabetes 3 months ago. (02/11/21) Management since then includes set an alarm and work on getting medication taken in the mornings. He reports fair compliance with treatment. He is not having side effects.  Symptoms: No fatigue No foot ulcerations  No appetite changes No nausea  No paresthesia of the feet  No polydipsia  No polyuria No visual disturbances   No vomiting     Home blood sugar records:  none  Episodes of hypoglycemia? No    Current insulin regiment: Most Recent Eye Exam: UTD Current exercise: strenuous job Current diet habits: in general, a "healthy" diet    Pertinent Labs: Lab Results  Component Value Date   CHOL 185 11/02/2020   HDL 46 11/02/2020   LDLCALC 105 (H) 11/02/2020   LDLDIRECT 72 03/26/2018   TRIG 196 (H) 11/02/2020   CHOLHDL 4.0 11/02/2020   Lab Results  Component Value Date   NA 138 11/02/2020   K 4.5 11/02/2020   CREATININE 1.29 (H) 11/02/2020   GFRNONAA 60 11/02/2020   GFRAA 69 11/02/2020   GLUCOSE 252 (H) 11/02/2020     ---------------------------------------------------------------------------------------------------     Medications: Outpatient Medications Prior to Visit  Medication Sig   glipiZIDE (GLUCOTROL) 10 MG tablet Take 1 tablet (10 mg total) by mouth daily before breakfast.   metFORMIN (GLUCOPHAGE-XR) 500 MG 24 hr tablet Take 2 tablets  (1,000 mg total) by mouth 2 (two) times daily.   pravastatin (PRAVACHOL) 40 MG tablet Take 1 tablet (40 mg total) by mouth every evening.   No facility-administered medications prior to visit.         Objective    BP 124/82   Pulse 65   Temp 98.5 F (36.9 C) (Oral)   Ht 5\' 11"  (1.803 m)   Wt 261 lb 14.4 oz (118.8 kg)   SpO2 98%   BMI 36.53 kg/m    Physical Exam  General appearance: Mildly obese male, cooperative and in no acute distress Head: Normocephalic, without obvious abnormality, atraumatic Respiratory: Respirations even and unlabored, normal respiratory rate Extremities: All extremities are intact.  Skin: Skin color, texture, turgor normal. No rashes seen  Psych: Appropriate mood and affect. Neurologic: Mental status: Alert, oriented to person, place, and time, thought content appropriate.   Results for orders placed or performed in visit on 06/10/21  POCT HgB A1C  Result Value Ref Range   Hemoglobin A1C 10.0 (A) 4.0 - 5.6 %    Assessment & Plan     1. Diabetes mellitus with nephropathy ([place]) Uncontrolled on current medications. Will add- Semaglutide (RYBELSUS) 3 MG TABS; Take 3 mg by mouth daily.  Dispense: 30 tablet; Refill: 0   Will call patient in 3-4 weeks and increase to 7mg  if tolerating.   Future Appointments  Date Time Provider Department  Center  08/26/2021  3:45 PM [name], MD [facility] None  09/16/2021  8:40 AM Birdie Sons, MD [facility]     Flu vaccine was recommended but refused by patient      The entirety of the information documented in the History of Present Illness, Review of Systems and Physical Exam were personally obtained by me. Portions of this information were initially documented by the CMA and reviewed by me for thoroughness and accuracy.     Lelon Huh, MD  [facility] [phone] (phone) [phone] (fax)  [place]

## 2021-06-20 ENCOUNTER — Other Ambulatory Visit: Payer: Self-pay | Admitting: Family Medicine

## 2021-07-05 ENCOUNTER — Telehealth: Payer: Self-pay | Admitting: Family Medicine

## 2021-07-05 DIAGNOSIS — E1121 Type 2 diabetes mellitus with diabetic nephropathy: Secondary | ICD-10-CM

## 2021-07-05 NOTE — Telephone Encounter (Signed)
Please check with patient and see if having any trouble with samples of Rybelsus. If he is tolerating well then will send in Rx for 7mg  tablets.

## 2021-07-06 MED ORDER — RYBELSUS 7 MG PO TABS: 7.0000 mg | ORAL_TABLET | Freq: Every day | ORAL | 2 refills | Status: DC

## 2021-07-06 NOTE — Telephone Encounter (Signed)
I called and spoke with patient. He reports that he is tolerating Rybelsus well with no side effects. Patient would like a prescription for the 7mg  dose sent to [facility].

## 2021-07-08 ENCOUNTER — Other Ambulatory Visit: Payer: Self-pay | Admitting: Family Medicine

## 2021-08-26 ENCOUNTER — Other Ambulatory Visit: Payer: Self-pay | Admitting: Family Medicine

## 2021-08-26 ENCOUNTER — Ambulatory Visit: Payer: Self-pay | Admitting: Urology

## 2021-08-26 DIAGNOSIS — E1121 Type 2 diabetes mellitus with diabetic nephropathy: Secondary | ICD-10-CM

## 2021-08-26 MED ORDER — RYBELSUS 7 MG PO TABS: 7.0000 mg | ORAL_TABLET | Freq: Every day | ORAL | 1 refills | Status: DC

## 2021-08-26 MED ORDER — PRAVASTATIN SODIUM 40 MG PO TABS: 40.0000 mg | ORAL_TABLET | Freq: Every evening | ORAL | 2 refills | Status: DC

## 2021-08-26 NOTE — Telephone Encounter (Signed)
Requested Prescriptions  Pending Prescriptions Disp Refills  . pravastatin (PRAVACHOL) 40 MG tablet 90 tablet 4    Sig: Take 1 tablet (40 mg total) by mouth every evening.     Cardiovascular:  Antilipid - Statins Failed - 08/26/2021  4:49 PM      Failed - LDL in normal range and within 360 days    LDL Chol Calc (NIH)  Date Value Ref Range Status  11/02/2020 105 (H) 0 - 99 mg/dL Final   LDL Direct  Date Value Ref Range Status  03/26/2018 72 0 - 99 mg/dL Final         Failed - Triglycerides in normal range and within 360 days    Triglycerides  Date Value Ref Range Status  11/02/2020 196 (H) 0 - 149 mg/dL Final         Passed - Total Cholesterol in normal range and within 360 days    Cholesterol, Total  Date Value Ref Range Status  11/02/2020 185 100 - 199 mg/dL Final         Passed - HDL in normal range and within 360 days    HDL  Date Value Ref Range Status  11/02/2020 46 >39 mg/dL Final         Passed - Patient is not pregnant      Passed - Valid encounter within last 12 months    Recent Outpatient Visits          2 months ago Diabetes mellitus with nephropathy [facility])   [facility] Birdie Sons, MD   6 months ago Diabetes mellitus with nephropathy [facility])   [facility] Birdie Sons, MD   9 months ago Annual physical exam   [facility] Birdie Sons, MD   2 years ago Diabetes mellitus with nephropathy [facility])   [facility] Birdie Sons, MD   2 years ago Diabetes mellitus with nephropathy [facility])   [facility] Birdie Sons, MD      Future Appointments            In 1 week [name], MD [place]   In 3 weeks Caryn Section, Kirstie Peri, MD [facility]           . Semaglutide (RYBELSUS) 7 MG TABS 30 tablet 2    Sig: Take 7 mg by mouth daily.     Off-Protocol Failed - 08/26/2021  4:49 PM      Failed - Medication not assigned to a  protocol, review manually.      Passed - Valid encounter within last 12 months    Recent Outpatient Visits          2 months ago Diabetes mellitus with nephropathy [facility])   [facility] Birdie Sons, MD   6 months ago Diabetes mellitus with nephropathy [facility])   [facility] Birdie Sons, MD   9 months ago Annual physical exam   [facility] Birdie Sons, MD   2 years ago Diabetes mellitus with nephropathy [facility])   [facility] Birdie Sons, MD   2 years ago Diabetes mellitus with nephropathy [facility])   [facility] Birdie Sons, MD      Future Appointments            In 1 week [name], MD [place]   In 3 weeks Caryn Section, Kirstie Peri, MD [facility] [place]

## 2021-08-26 NOTE — Telephone Encounter (Signed)
Copied from [place] [id number]. Topic: Quick Communication - Rx Refill/Question >> Aug 26, 2021  4:36 PM Yvette Rack wrote: Ronald Shah with [facility] requests 90 day supply of the following:  Medication: pravastatin (PRAVACHOL) 40 MG tablet and Semaglutide (RYBELSUS) 7 MG TABS  Has the patient contacted their pharmacy? Yes.   (Agent: If no, request that the patient contact the pharmacy for the refill. If patient does not wish to contact the pharmacy document the reason why and proceed with request.) (Agent: If yes, when and what did the pharmacy advise?)  Preferred Pharmacy (with phone number or street name): [place], [place]  Phone:  [phone] Fax:  [phone]  Has the patient been seen for an appointment in the last year OR does the patient have an upcoming appointment? Yes.    Agent: Please be advised that RX refills may take up to 3 business days. We ask that you follow-up with your pharmacy.

## 2021-08-30 ENCOUNTER — Other Ambulatory Visit
Admission: RE | Admit: 2021-08-30 | Discharge: 2021-08-30 | Disposition: A | Discharge status: Home / Self Care | Payer: Managed Care, Other (non HMO) | Attending: Urology | Admitting: Urology

## 2021-08-30 ENCOUNTER — Other Ambulatory Visit: Payer: Self-pay

## 2021-08-30 DIAGNOSIS — C61 Malignant neoplasm of prostate: Secondary | ICD-10-CM | POA: Diagnosis present

## 2021-08-30 LAB — PSA: Prostatic Specific Antigen: 5.56 ng/mL — ABNORMAL HIGH (ref 0.00–4.00)

## 2021-09-01 NOTE — Progress Notes (Signed)
09/02/21 12:54 PM   DOYE MONTILLA [birth year] [id number]  Referring provider:  Birdie Sons, [place],  [place] [address] Chief Complaint  Patient presents with   Prostate Cancer    HPI: Ronald Shah is a 60 y.o.male with a personal history low risk prostate cancer who presents today for 6 month with PSA and DRE.   He underwent prostate biopsy on 07/22/2019 which was uncomplicated.  12 core biopsy revealed 1 of 3 cores with Gleason 3+3, 10% at the right lateral apex.  There is also a suspicious area at the right lateral mid but not diagnostic for prostate cancer. There is other areas of acute and chronic inflammation within the specimen.  TRUS volume 61.2 cc.     Prostate MRI shows 2 lesion, PIRADS category 4 lesion in the anterior RIGHT transitional zone near the apex on 12/21.    His most recent PSA on 08/30/2021 was 5.56.   PSA trend:  Component Prostatic Specific Antigen  Latest Ref Rng & Units 0.00 - 4.00 ng/mL  02/09/2020 3.49  08/27/2020 4.86 (H)  02/17/2021 4.62 (H)  08/30/2021 5.56 (H)    PMH: Past Medical History:  Diagnosis Date   History of chicken pox    Prostate cancer [facility])     Surgical History: History reviewed. No pertinent surgical history.  Home Medications:  Allergies as of 09/02/2021   No Known Allergies      Medication List        Accurate as of September 02, 2021 12:54 PM. If you have any questions, ask your nurse or doctor.          glipiZIDE 10 MG tablet Commonly known as: GLUCOTROL TAKE 1 TABLET DAILY BEFORE BREAKFAST   metFORMIN 500 MG 24 hr tablet Commonly known as: GLUCOPHAGE-XR TAKE 2 TABLETS TWICE A DAY   pravastatin 40 MG tablet Commonly known as: PRAVACHOL Take 1 tablet (40 mg total) by mouth every evening.   Rybelsus 7 MG Tabs Generic drug: Semaglutide Take 7 mg by mouth daily.        Allergies: No Known Allergies  Family History: Family History  Problem Relation Age of  Onset   Diabetes Mother     Social History:  reports that he has quit smoking. His smoking use included e-cigarettes. He has never used smokeless tobacco. He reports current alcohol use. He reports that he does not use drugs.   Physical Exam: BP 133/85    Pulse 76    Ht 6' (1.829 m)    Wt 253 lb (114.8 kg)    BMI 34.31 kg/m   Constitutional:  Alert and oriented, No acute distress. HEENT: [place] AT, moist mucus membranes.  Trachea midline, no masses. Cardiovascular: No clubbing, cyanosis, or edema. Respiratory: Normal respiratory effort, no increased work of breathing. Skin: No rashes, bruises or suspicious lesions. Neurologic: Grossly intact, no focal deficits, moving all 4 extremities. Psychiatric: Normal mood and affect.  Laboratory Data:  Lab Results  Component Value Date   CREATININE 1.29 (H) 11/02/2020   Lab Results  Component Value Date   HGBA1C 10.0 (A) 06/10/2021    Assessment & Plan:    1. Prostate cancer ([place]) Low risk prostate cancer on active surveillance however has had a more significant rise in his PSA and most recently up to 5.56  He has a known PI-RADS 4 lesion has never underwent fusion biopsy.  We discussed various options today but at this point, most strongly  recommend either repeating MRI with plans for fusion biopsy versus cognitive fusion biopsy versus fusion biopsy using old MRI.  Alternatives including continued to survey his PSA and or other restratification's were also discussed.  He is agreeable to repeat the MRI and plan for fusion biopsy in [place] shortly thereafter.  We will have him follow-up after this biopsy.  He had a biopsy before, understands risks and benefits.  All questions were answered. - MR PROSTATE W WO CONTRAST; Future - Ambulatory referral to Urology  Conley Rolls as a scribe for [name], MD.,have documented all relevant documentation on the behalf of [name], MD,as directed by  [name], MD  while in the presence of [name], MD.  I have reviewed the above documentation for accuracy and completeness, and I agree with the above.   [name], MD   [facility] [address], [place], [place] [address] [phone]

## 2021-09-02 ENCOUNTER — Ambulatory Visit: Payer: Managed Care, Other (non HMO) | Admitting: Urology

## 2021-09-02 ENCOUNTER — Other Ambulatory Visit: Payer: Self-pay

## 2021-09-02 ENCOUNTER — Encounter: Payer: Self-pay | Admitting: Urology

## 2021-09-02 VITALS — BP 133/85 | HR 76 | Ht 72.0 in | Wt 253.0 lb

## 2021-09-02 DIAGNOSIS — C61 Malignant neoplasm of prostate: Secondary | ICD-10-CM

## 2021-09-09 ENCOUNTER — Ambulatory Visit
Admission: RE | Admit: 2021-09-09 | Discharge: 2021-09-09 | Disposition: A | Discharge status: Home / Self Care | Payer: Managed Care, Other (non HMO) | Source: Ambulatory Visit | Attending: Urology | Admitting: Urology

## 2021-09-09 ENCOUNTER — Other Ambulatory Visit: Payer: Self-pay

## 2021-09-09 DIAGNOSIS — C61 Malignant neoplasm of prostate: Secondary | ICD-10-CM | POA: Insufficient documentation

## 2021-09-09 IMAGING — MR MR PROSTATE WO/W CM
56 series · 56 of 56 positions shown · IV contrast (10ml Gadavist)
Comparison: Prostate MRI [DATE]

CLINICAL DATA: 60-year-old male with prostate carcinoma. Gleason 6
adenocarcinoma prior biopsy. PSA equal 3.49.

EXAM:
MR PROSTATE WITHOUT AND WITH CONTRAST
TECHNIQUE: Multiplanar multisequence MRI images were obtained of the pelvis
centered about the prostate. Pre and post contrast images were
obtained.
CONTRAST:  10mL GADAVIST GADOBUTROL 1 MMOL/ML IV SOLN

[Series 3: ax in&out whole · axial · 6.0mm · 0.74mm/px · 1 of 35 slices shown (1 of 2)]
[im 1/35]
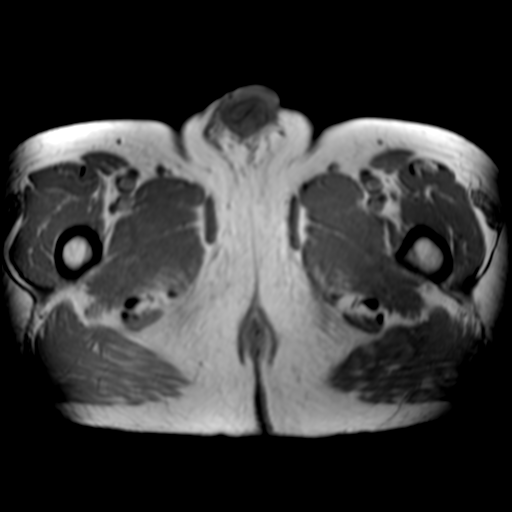

[Series 3: ax in&out whole · axial · 6.0mm · 0.74mm/px · 1 of 35 slices shown (2 of 2)]
[im 1/35]
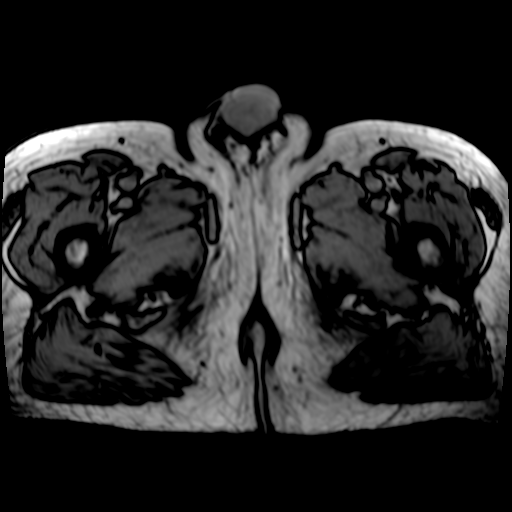

[Series 4: T2 · coronal · 3.0mm · 0.70mm/px · 1 of 35 slices shown (1 of 3)]
[im 1/35]
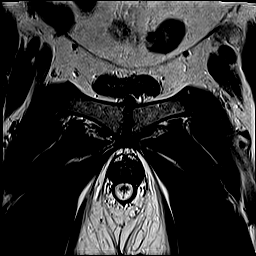

[Series 6: DWI · axial · 3.0mm · 0.86mm/px · 1 of 75 slices shown (1 of 3)]
[im 1/75]
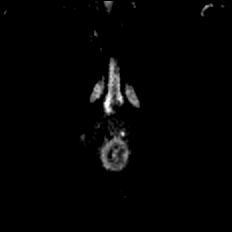

[Series 7: DWI · axial · 3.0mm · 0.86mm/px · 1 of 25 slices shown (2 of 3)]
[im 1/25]
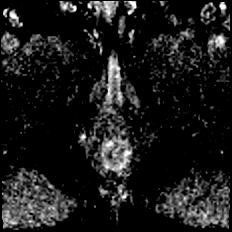

[Series 8: DWI · axial · 3.0mm · 0.86mm/px · 1 of 25 slices shown (3 of 3)]
[im 1/25]
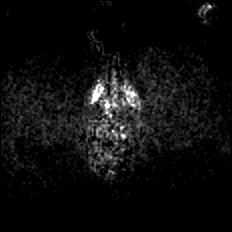

[Series 9: T2 · axial · 1.0mm · 1.04mm/px · 1 of 80 slices shown (2 of 3)]
[im 1/80]
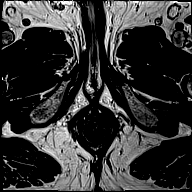

[Series 10: T2 · axial · 3.0mm · 0.56mm/px · 1 of 25 slices shown (3 of 3)]
[im 1/25]
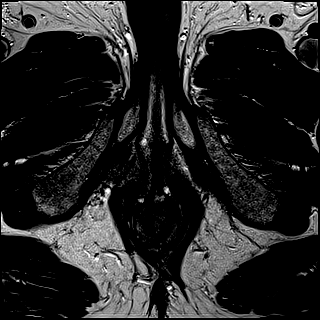

[Series 11: T1 · axial · 3.0mm · 1.15mm/px · 1 of 28 slices shown (1 of 48)]
[im 1/28]
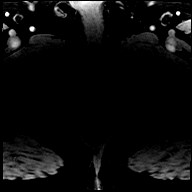

[Series 12: T1 · axial · 3.0mm · 1.15mm/px · 1 of 28 slices shown (2 of 48)]
[im 1/28]
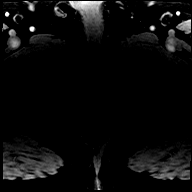

[Series 13: T1 · axial · 3.0mm · 1.15mm/px · 1 of 27 slices shown (3 of 48)]
[im 1/27]
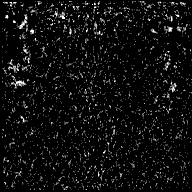

[Series 14: T1 · axial · 3.0mm · 1.15mm/px · 1 of 28 slices shown (4 of 48)]
[im 1/28]
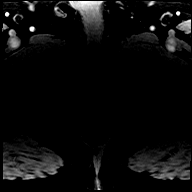

[Series 15: T1 · axial · 3.0mm · 1.15mm/px · 1 of 28 slices shown (5 of 48)]
[im 1/28]
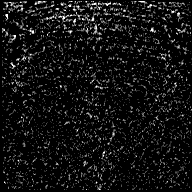

[Series 16: T1 · axial · 3.0mm · 1.15mm/px · 1 of 28 slices shown (6 of 48)]
[im 1/28]
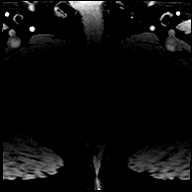

[Series 17: T1 · axial · 3.0mm · 1.15mm/px · 1 of 28 slices shown (7 of 48)]
[im 1/28]
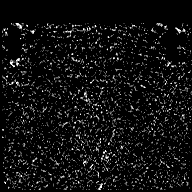

[Series 18: T1 · axial · 3.0mm · 1.15mm/px · 1 of 28 slices shown (8 of 48)]
[im 1/28]
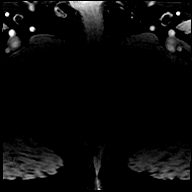

[Series 19: T1 · axial · 3.0mm · 1.15mm/px · 1 of 28 slices shown (9 of 48)]
[im 1/28]
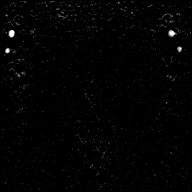

[Series 20: T1 · axial · 3.0mm · 1.15mm/px · 1 of 28 slices shown (10 of 48)]
[im 1/28]
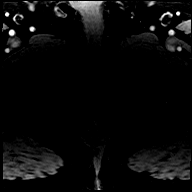

[Series 21: T1 · axial · 3.0mm · 1.15mm/px · 1 of 28 slices shown (11 of 48)]
[im 1/28]
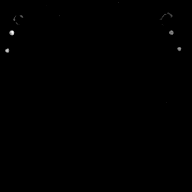

[Series 22: T1 · axial · 3.0mm · 1.15mm/px · 1 of 28 slices shown (12 of 48)]
[im 1/28]
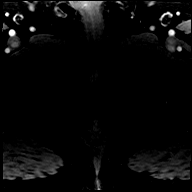

[Series 23: T1 · axial · 3.0mm · 1.15mm/px · 1 of 28 slices shown (13 of 48)]
[im 1/28]
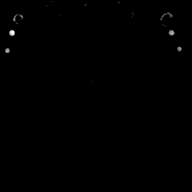

[Series 24: T1 · axial · 3.0mm · 1.15mm/px · 1 of 28 slices shown (14 of 48)]
[im 1/28]
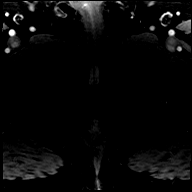

[Series 25: T1 · axial · 3.0mm · 1.15mm/px · 1 of 28 slices shown (15 of 48)]
[im 1/28]
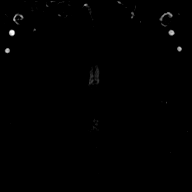

[Series 26: T1 · axial · 3.0mm · 1.15mm/px · 1 of 28 slices shown (16 of 48)]
[im 1/28]
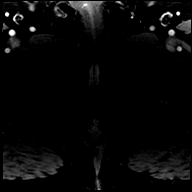

[Series 27: T1 · axial · 3.0mm · 1.15mm/px · 1 of 28 slices shown (17 of 48)]
[im 1/28]
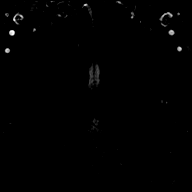

[Series 28: T1 · axial · 3.0mm · 1.15mm/px · 1 of 28 slices shown (18 of 48)]
[im 1/28]
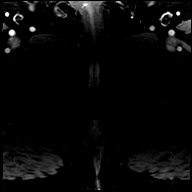

[Series 29: T1 · axial · 3.0mm · 1.15mm/px · 1 of 28 slices shown (19 of 48)]
[im 1/28]
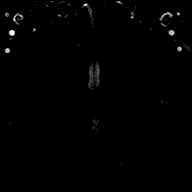

[Series 30: T1 · axial · 3.0mm · 1.15mm/px · 1 of 28 slices shown (20 of 48)]
[im 1/28]
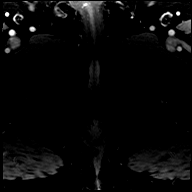

[Series 31: T1 · axial · 3.0mm · 1.15mm/px · 1 of 28 slices shown (21 of 48)]
[im 1/28]
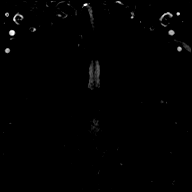

[Series 32: T1 · axial · 3.0mm · 1.15mm/px · 1 of 28 slices shown (22 of 48)]
[im 1/28]
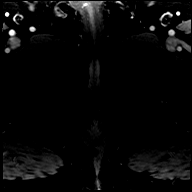

[Series 33: T1 · axial · 3.0mm · 1.15mm/px · 1 of 28 slices shown (23 of 48)]
[im 1/28]
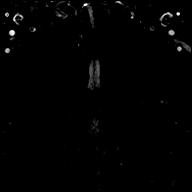

[Series 34: T1 · axial · 3.0mm · 1.15mm/px · 1 of 28 slices shown (24 of 48)]
[im 1/28]
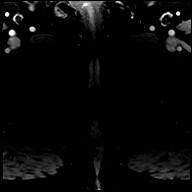

[Series 35: T1 · axial · 3.0mm · 1.15mm/px · 1 of 28 slices shown (25 of 48)]
[im 1/28]
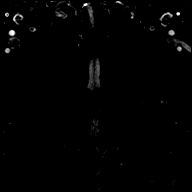

[Series 36: T1 · axial · 3.0mm · 1.15mm/px · 1 of 28 slices shown (26 of 48)]
[im 1/28]
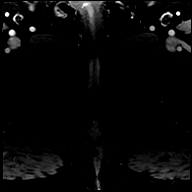

[Series 37: T1 · axial · 3.0mm · 1.15mm/px · 1 of 28 slices shown (27 of 48)]
[im 1/28]
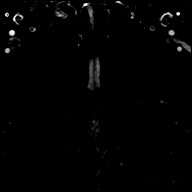

[Series 38: T1 · axial · 3.0mm · 1.15mm/px · 1 of 28 slices shown (28 of 48)]
[im 1/28]
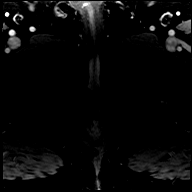

[Series 39: T1 · axial · 3.0mm · 1.15mm/px · 1 of 28 slices shown (29 of 48)]
[im 1/28]
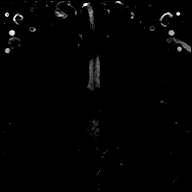

[Series 40: T1 · axial · 3.0mm · 1.15mm/px · 1 of 28 slices shown (30 of 48)]
[im 1/28]
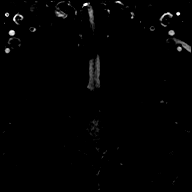

[Series 41: T1 · axial · 3.0mm · 1.15mm/px · 1 of 28 slices shown (31 of 48)]
[im 1/28]
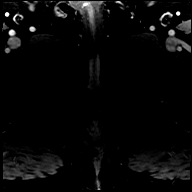

[Series 42: T1 · axial · 3.0mm · 1.15mm/px · 1 of 28 slices shown (32 of 48)]
[im 1/28]
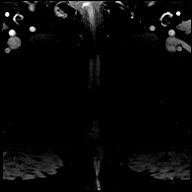

[Series 43: T1 · axial · 3.0mm · 1.15mm/px · 1 of 28 slices shown (33 of 48)]
[im 1/28]
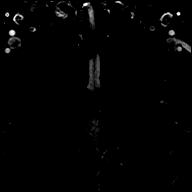

[Series 44: T1 · axial · 3.0mm · 1.15mm/px · 1 of 28 slices shown (34 of 48)]
[im 1/28]
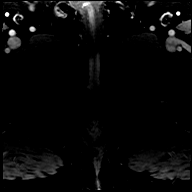

[Series 45: T1 · axial · 3.0mm · 1.15mm/px · 1 of 28 slices shown (35 of 48)]
[im 1/28]
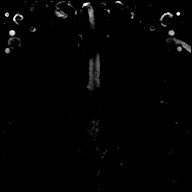

[Series 46: T1 · axial · 3.0mm · 1.15mm/px · 1 of 28 slices shown (36 of 48)]
[im 1/28]
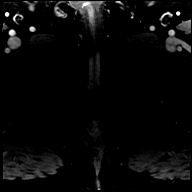

[Series 47: T1 · axial · 3.0mm · 1.15mm/px · 1 of 28 slices shown (37 of 48)]
[im 1/28]
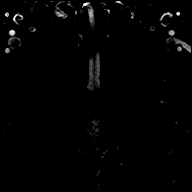

[Series 48: T1 · axial · 3.0mm · 1.15mm/px · 1 of 28 slices shown (38 of 48)]
[im 1/28]
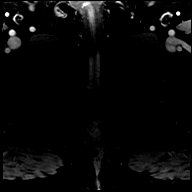

[Series 49: T1 · axial · 3.0mm · 1.15mm/px · 1 of 28 slices shown (39 of 48)]
[im 1/28]
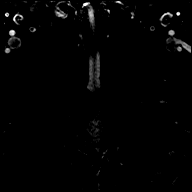

[Series 50: T1 · axial · 3.0mm · 1.15mm/px · 1 of 28 slices shown (40 of 48)]
[im 1/28]
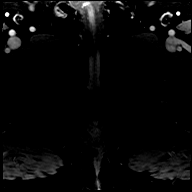

[Series 51: T1 · axial · 3.0mm · 1.15mm/px · 1 of 28 slices shown (41 of 48)]
[im 1/28]
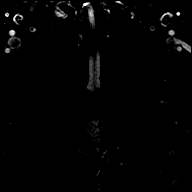

[Series 52: T1 · axial · 3.0mm · 1.15mm/px · 1 of 28 slices shown (42 of 48)]
[im 1/28]
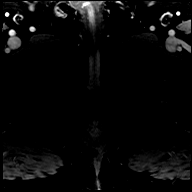

[Series 53: T1 · axial · 3.0mm · 1.15mm/px · 1 of 28 slices shown (43 of 48)]
[im 1/28]
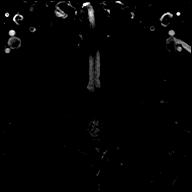

[Series 54: T1 · axial · 3.0mm · 1.15mm/px · 1 of 28 slices shown (44 of 48)]
[im 1/28]
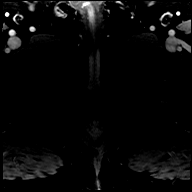

[Series 55: T1 · axial · 3.0mm · 1.15mm/px · 1 of 28 slices shown (45 of 48)]
[im 1/28]
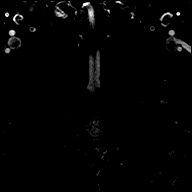

[Series 56: T1 · axial · 3.0mm · 1.15mm/px · 1 of 28 slices shown (46 of 48)]
[im 1/28]
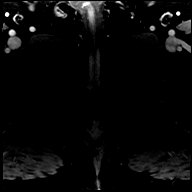

[Series 57: T1 · axial · 3.0mm · 1.15mm/px · 1 of 28 slices shown (47 of 48)]
[im 1/28]
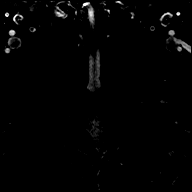

[Series 58: T1 · axial · 3.0mm · 1.15mm/px · 1 of 28 slices shown (48 of 48)]
[im 1/28]
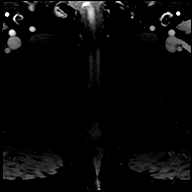

[56 of 56 positions shown; findings below may reference images not displayed]

FINDINGS: Prostate: No foci of restricted diffusion within the peripheral
zone. Linear striations within the peripheral zone suggesting prior
inflammation.

Again demonstrated lesion in the anterior RIGHT transitional zone
signal lesion with low signal intensity on T2 weighted imaging
measuring 8 mm (image image 17/series 10). There is restricted
diffusion at this site similar to comparison exam (image [DATE]).

Volume: 4.8 x 4.0 by 4.1 cm (volume = 41 cm^3)

Transcapsular spread:  Absent

Seminal vesicle involvement: Absent

Neurovascular bundle involvement: Absent

Pelvic adenopathy: Absent

Bone metastasis: Absent

Other findings: None
IMPRESSION: 1. No high-grade carcinoma within the peripheral zone. Linear
striations most consistent prior inflammation. PI-RADS: 2
2. Nodular lesion in the anterior RIGHT transitional zone is not
changed from comparison exam. PI-RADS: 4. (Dynacad 3D post
processing performed) LEMA #1.

## 2021-09-09 MED ORDER — GADOBUTROL 1 MMOL/ML IV SOLN
10.0000 mL | Freq: Once | INTRAVENOUS | Status: AC | PRN
  Administered 2021-09-09: 17:00:00 10 mL via INTRAVENOUS

## 2021-09-15 NOTE — Progress Notes (Deleted)
°  ° ° °  Established patient visit   Patient: Ronald Shah   DOB: [birth year]   60 y.o. Male  MRN: [id number] Visit Date: 09/16/2021  Today's healthcare provider: Lelon Huh, MD   No chief complaint on file.  Subjective    HPI  Diabetes Mellitus Type II, Follow-up  Lab Results  Component Value Date   HGBA1C 10.0 (A) 06/10/2021   HGBA1C 9.4 (A) 02/11/2021   HGBA1C 8.7 (A) 11/02/2020   Wt Readings from Last 3 Encounters:  09/02/21 253 lb (114.8 kg)  06/10/21 261 lb 14.4 oz (118.8 kg)  03/11/21 250 lb (113.4 kg)   Last seen for diabetes 3 months ago.  Management since then includes adding- Semaglutide (RYBELSUS) 3 MG TABS; Take 3 mg by mouth daily. Dosage was later increased to 7mg .  He reports fair compliance with treatment.(forgets to take at night) He is not having side effects.  Symptoms: No fatigue No foot ulcerations  No appetite changes No nausea  No paresthesia of the feet  No polydipsia  No polyuria No visual disturbances   No vomiting     Home blood sugar records: fasting range: 180-190's  Episodes of hypoglycemia? No    Current insulin regiment: none Most Recent Eye Exam: not UTD Current exercise: none Current diet habits: in general, an "unhealthy" diet  Pertinent Labs: Lab Results  Component Value Date   CHOL 185 11/02/2020   HDL 46 11/02/2020   LDLCALC 105 (H) 11/02/2020   LDLDIRECT 72 03/26/2018   TRIG 196 (H) 11/02/2020   CHOLHDL 4.0 11/02/2020   Lab Results  Component Value Date   NA 138 11/02/2020   K 4.5 11/02/2020   CREATININE 1.29 (H) 11/02/2020   GFRNONAA 60 11/02/2020   MICROALBUR negative 02/11/2021     ---------------------------------------------------------------------------------------------------   Medications: Outpatient Medications Prior to Visit  Medication Sig   glipiZIDE (GLUCOTROL) 10 MG tablet TAKE 1 TABLET DAILY BEFORE BREAKFAST   metFORMIN (GLUCOPHAGE-XR) 500 MG 24 hr tablet TAKE 2 TABLETS TWICE A DAY    pravastatin (PRAVACHOL) 40 MG tablet Take 1 tablet (40 mg total) by mouth every evening.   Semaglutide (RYBELSUS) 7 MG TABS Take 7 mg by mouth daily.   No facility-administered medications prior to visit.    Review of Systems  {Labs   Heme   Chem   Endocrine   Serology   Results Review (optional):23779}   Objective    There were no vitals taken for this visit. {Show previous vital signs (optional):23777}  Physical Exam  ***  No results found for any visits on 09/16/21.  Assessment & Plan     ***  No follow-ups on file.      {provider attestation***:1}   Lelon Huh, MD  [facility] [phone] (phone) [phone] (fax)  [place]

## 2021-09-16 ENCOUNTER — Other Ambulatory Visit: Payer: Self-pay

## 2021-09-16 ENCOUNTER — Ambulatory Visit: Payer: Managed Care, Other (non HMO) | Admitting: Family Medicine

## 2021-09-16 ENCOUNTER — Encounter: Payer: Self-pay | Admitting: Family Medicine

## 2021-09-16 VITALS — BP 116/82 | HR 71 | Temp 98.0°F | Ht 72.0 in | Wt 261.1 lb

## 2021-09-16 DIAGNOSIS — E1121 Type 2 diabetes mellitus with diabetic nephropathy: Secondary | ICD-10-CM

## 2021-09-16 LAB — POCT GLYCOSYLATED HEMOGLOBIN (HGB A1C)
Est. average glucose Bld gHb Est-mCnc: 192
Hemoglobin A1C: 8.3 % — AB (ref 4.0–5.6)

## 2021-09-16 MED ORDER — RYBELSUS 7 MG PO TABS
14.0000 mg | ORAL_TABLET | Freq: Every day | ORAL | Status: DC
Start: 2021-09-16 — End: 2021-09-21

## 2021-09-16 NOTE — Progress Notes (Signed)
Established patient visit   Patient: Ronald Shah   DOB: 1961/03/30   60 y.o. Male  MRN: [id number] Visit Date: 09/16/2021  Today's healthcare provider: Lelon Huh, MD   Chief Complaint  Patient presents with   Diabetes   Follow-up    Subjective    HPI  Diabetes Mellitus Type II, Follow-up  Lab Results  Component Value Date   HGBA1C 10.0 (A) 06/10/2021   HGBA1C 9.4 (A) 02/11/2021   HGBA1C 8.7 (A) 11/02/2020   Wt Readings from Last 3 Encounters:  09/16/21 261 lb 1.6 oz (118.4 kg)  09/02/21 253 lb (114.8 kg)  06/10/21 261 lb 14.4 oz (118.8 kg)   Last seen for diabetes 3 months ago.  Management since then includes adding- Semaglutide (RYBELSUS) 3 MG TABS; Take 3 mg by mouth daily. Dosage was later increased to 7mg .  He reports fair compliance with treatment.(forgets to take at night) He is not having side effects.  Symptoms: No fatigue No foot ulcerations  No appetite changes No nausea  No paresthesia of the feet  No polydipsia  No polyuria No visual disturbances   No vomiting     Home blood sugar records: fasting range: 180-190's  Episodes of hypoglycemia? No    Current insulin regiment: none Most Recent Eye Exam: not UTD Current exercise: none Current diet habits: in general, an "unhealthy" diet  Pertinent Labs: Lab Results  Component Value Date   CHOL 185 11/02/2020   HDL 46 11/02/2020   LDLCALC 105 (H) 11/02/2020   LDLDIRECT 72 03/26/2018   TRIG 196 (H) 11/02/2020   CHOLHDL 4.0 11/02/2020   Lab Results  Component Value Date   NA 138 11/02/2020   K 4.5 11/02/2020   CREATININE 1.29 (H) 11/02/2020   GFRNONAA 60 11/02/2020   MICROALBUR negative 02/11/2021     ---------------------------------------------------------------------------------------------------   Medications: Outpatient Medications Prior to Visit  Medication Sig   glipiZIDE (GLUCOTROL) 10 MG tablet TAKE 1 TABLET DAILY BEFORE BREAKFAST   metFORMIN (GLUCOPHAGE-XR) 500 MG  24 hr tablet TAKE 2 TABLETS TWICE A DAY   pravastatin (PRAVACHOL) 40 MG tablet Take 1 tablet (40 mg total) by mouth every evening.   [DISCONTINUED] Semaglutide (RYBELSUS) 7 MG TABS Take 7 mg by mouth daily.   No facility-administered medications prior to visit.    Review of Systems     Objective    BP 116/82 (BP Location: Left Arm, Patient Position: Sitting, Cuff Size: Large)    Pulse 71    Temp 98 F (36.7 C) (Oral)    Ht 6' (1.829 m)    Wt 261 lb 1.6 oz (118.4 kg)    SpO2 99%    BMI 35.41 kg/m  {Show previous vital signs (optional):23777}  Physical Exam  General appearance: Mildly obese male, cooperative and in no acute distress Head: Normocephalic, without obvious abnormality, atraumatic Respiratory: Respirations even and unlabored, normal respiratory rate Extremities: All extremities are intact.  Skin: Skin color, texture, turgor normal. No rashes seen  Psych: Appropriate mood and affect. Neurologic: Mental status: Alert, oriented to person, place, and time, thought content appropriate.    Results for orders placed or performed in visit on 09/16/21  POCT HgB A1C  Result Value Ref Range   Hemoglobin A1C 8.3 (A) 4.0 - 5.6 %   Est. average glucose Bld gHb Est-mCnc 192      Assessment & Plan     1. Diabetes mellitus with nephropathy ([place]) Better with addition of Rybelsus, but  not to goal. He is not having any adverse effects so will double dose to  Semaglutide (RYBELSUS) 7 MG TABS; Take 14 mg by mouth daily.   [place] send in prescription for 14mg  tablets next week to [facility] unless he is having side effects.   Future Appointments  Date Time Provider [place]  12/12/2021  2:20 PM Birdie Sons, MD [id number]         The entirety of the information documented in the History of Present Illness, Review of Systems and Physical Exam were personally obtained by me. Portions of this information were initially documented by the CMA and reviewed by me for  thoroughness and accuracy.     Lelon Huh, MD  [facility] [phone] (phone) [phone] (fax)  [place]

## 2021-09-16 NOTE — Patient Instructions (Signed)
Please review the attached list of medications and notify my office if there are any errors.   I'll send in a prescription for 14mg  tablets of Rybelsus next week if you are tolerating 2 x 7mg  tablets a day. Let me know If you have any problems with the higher dose by next Wednesday

## 2021-09-20 ENCOUNTER — Telehealth: Payer: Self-pay | Admitting: *Deleted

## 2021-09-20 DIAGNOSIS — N401 Enlarged prostate with lower urinary tract symptoms: Secondary | ICD-10-CM

## 2021-09-20 DIAGNOSIS — R972 Elevated prostate specific antigen [PSA]: Secondary | ICD-10-CM

## 2021-09-20 DIAGNOSIS — R351 Nocturia: Secondary | ICD-10-CM

## 2021-09-20 DIAGNOSIS — C61 Malignant neoplasm of prostate: Secondary | ICD-10-CM

## 2021-09-20 NOTE — Telephone Encounter (Addendum)
Patient informed, voiced understanding. Placed order for Fusion biopsy. Aware there is a delay in scheduling.  ----- Message from [name], MD sent at 09/20/2021 10:23 AM EST ----- Prostate MRI looks essentially the same as the previous which is somewhat reassuring.  In the setting of rising PSA however, agree with repeat fusion biopsy as discussed.  [name], MD

## 2021-09-21 ENCOUNTER — Other Ambulatory Visit: Payer: Self-pay | Admitting: Family Medicine

## 2021-09-21 DIAGNOSIS — E1121 Type 2 diabetes mellitus with diabetic nephropathy: Secondary | ICD-10-CM

## 2021-09-21 MED ORDER — RYBELSUS 14 MG PO TABS
14.0000 mg | ORAL_TABLET | Freq: Every day | ORAL | 4 refills | Status: DC
Start: 2021-09-21 — End: 2022-03-28

## 2021-10-28 ENCOUNTER — Other Ambulatory Visit: Payer: Self-pay | Admitting: Urology

## 2021-10-31 NOTE — Progress Notes (Incomplete)
10/31/21 8:23 AM   Ronald Shah 1961/06/29 [id number]  Referring provider:  Birdie Sons, [place] [facility] [place],  [place] [address] No chief complaint on file.    HPI: Ronald Shah is a 61 y.o.male  with a personal history low risk prostate cancer who presents today for fusion biopsy results.   He underwent prostate biopsy on 07/22/2019 which was uncomplicated.  12 core biopsy revealed 1 of 3 cores with Gleason 3+3, 10% at the right lateral apex.  There is also a suspicious area at the right lateral mid but not diagnostic for prostate cancer. There is other areas of acute and chronic inflammation within the specimen.  TRUS volume 61.2 cc.     Prostate MRI shows 2 lesion, PIRADS category 4 lesion in the anterior RIGHT transitional zone near the apex on 12/21.     His most recent PSA on 08/30/2021 was 5.56.   Fusion biopsy on 10/26/2021 was consistent with Gleason 3+3 involving prostate, ROI, MRI lesion, right, 4 samples, and 2 cores affecting up to 20%.  PSA trend:  Component Prostatic Specific Antigen  Latest Ref Rng & Units 0.00 - 4.00 ng/mL  02/09/2020 3.49  08/27/2020 4.86 (H)  02/17/2021 4.62 (H)  08/30/2021 5.56 (H)        PMH: Past Medical History:  Diagnosis Date   History of chicken pox    Prostate cancer [facility])     Surgical History: No past surgical history on file.  Home Medications:  Allergies as of 11/01/2021   No Known Allergies      Medication List        Accurate as of October 31, 2021  8:23 AM. If you have any questions, ask your nurse or doctor.          glipiZIDE 10 MG tablet Commonly known as: GLUCOTROL TAKE 1 TABLET DAILY BEFORE BREAKFAST   metFORMIN 500 MG 24 hr tablet Commonly known as: GLUCOPHAGE-XR TAKE 2 TABLETS TWICE A DAY   pravastatin 40 MG tablet Commonly known as: PRAVACHOL Take 1 tablet (40 mg total) by mouth every evening.   Rybelsus 14 MG Tabs Generic drug: Semaglutide Take 14 mg by mouth  daily.        Allergies: No Known Allergies  Family History: Family History  Problem Relation Age of Onset   Diabetes Mother     Social History:  reports that he has quit smoking. His smoking use included e-cigarettes. He has never used smokeless tobacco. He reports current alcohol use. He reports that he does not use drugs.   Physical Exam: There were no vitals taken for this visit.  Constitutional:  Alert and oriented, No acute distress. HEENT: [place] AT, moist mucus membranes.  Trachea midline, no masses. Cardiovascular: No clubbing, cyanosis, or edema. Respiratory: Normal respiratory effort, no increased work of breathing. Skin: No rashes, bruises or suspicious lesions. Neurologic: Grossly intact, no focal deficits, moving all 4 extremities. Psychiatric: Normal mood and affect.  Laboratory Data: Lab Results  Component Value Date   CREATININE 1.29 (H) 11/02/2020   Lab Results  Component Value Date   PSA 2.1 07/08/2014   Lab Results  Component Value Date   HGBA1C 8.3 (A) 09/16/2021    Urinalysis   Pertinent Imaging:   Assessment & Plan:     No follow-ups on file.  I,Kailey Littlejohn,acting as a scribe for [name], MD.,have documented all relevant documentation on the behalf of [name], MD,as directed by  Caryl Pina  Erlene Quan, MD while in the presence of [name], MD.   [facility] [address], [place], [place] [address] [phone]

## 2021-11-01 ENCOUNTER — Ambulatory Visit: Payer: Managed Care, Other (non HMO) | Admitting: Urology

## 2021-11-01 NOTE — Progress Notes (Addendum)
11/07/21 3:16 PM   SCORPIO FORTIN Feb 09, 1961 [id number]  Referring provider:  Birdie Sons, [place],  [place] [address] Chief Complaint  Patient presents with   Benign Prostatic Hypertrophy     HPI: Ronald Shah is a 61 y.o.male with a personal history low risk prostate cancer who presents today for f/u status post fusion biopsy.  He underwent prostate biopsy on 07/22/2019 which was uncomplicated.  12 core biopsy revealed 1 of 3 cores with Gleason 3+3, 10% at the right lateral apex.  There is also a suspicious area at the right lateral mid but not diagnostic for prostate cancer. There is other areas of acute and chronic inflammation within the specimen.  TRUS volume 61.2 cc.     Prostate MRI shows 2 lesion, PIRADS category 4 lesion in the anterior RIGHT transitional zone near the apex on 12/21.    His most recent PSA on 08/30/2021 was 5.56.   He underwent a fusion biopsy on 10/26/2021 surgical pathology was consistent with Gleason 3 +3 involving Prostate, ROI, MRI lesion, right 4 samples, and 3 cores affecting up to 20 %, and high grade prostatic intraepithelial neoplasia involving 2 cores.  He has tried Flomax previously w/o much improvement in his nocturia.   He reports that he is having trouble with his erections.   \   IPSS     Row Name 11/02/21 0900         International Prostate Symptom Score   How often have you had the sensation of not emptying your bladder? Not at All     How often have you had to urinate less than every two hours? Less than 1 in 5 times     How often have you found you stopped and started again several times when you urinated? Not at All     How often have you found it difficult to postpone urination? Not at All     How often have you had a weak urinary stream? Less than half the time     How often have you had to strain to start urination? Not at All     How many times did you typically get up at night to  urinate? 2 Times     Total IPSS Score 5       Quality of Life due to urinary symptoms   If you were to spend the rest of your life with your urinary condition just the way it is now how would you feel about that? Mixed              Score:  1-7 Mild 8-19 Moderate 20-35 Severe  SHIM     Row Name 11/02/21 0947         SHIM: Over the last 6 months:   How do you rate your confidence that you could get and keep an erection? Low     When you had erections with sexual stimulation, how often were your erections hard enough for penetration (entering your partner)? Sometimes (about half the time)     During sexual intercourse, how often were you able to maintain your erection after you had penetrated (entered) your partner? Sometimes (about half the time)     During sexual intercourse, how difficult was it to maintain your erection to completion of intercourse? Difficult     When you attempted sexual intercourse, how often was it satisfactory for you? A Few Times (much less than  half the time)       SHIM Total Score   SHIM 13                PMH: Past Medical History:  Diagnosis Date   History of chicken pox    Prostate cancer [facility])     Surgical History: No past surgical history on file.  Home Medications:  Allergies as of 11/02/2021   No Known Allergies      Medication List        Accurate as of November 02, 2021 11:59 PM. If you have any questions, ask your nurse or doctor.          glipiZIDE 10 MG tablet Commonly known as: GLUCOTROL TAKE 1 TABLET DAILY BEFORE BREAKFAST   metFORMIN 500 MG 24 hr tablet Commonly known as: GLUCOPHAGE-XR TAKE 2 TABLETS TWICE A DAY   pravastatin 40 MG tablet Commonly known as: PRAVACHOL Take 1 tablet (40 mg total) by mouth every evening.   Rybelsus 14 MG Tabs Generic drug: Semaglutide Take 14 mg by mouth daily.   sildenafil 20 MG tablet Commonly known as: REVATIO Take 3-5 tablets 1 hour prior to intercourse as  needed Started by: [name], MD        Allergies: No Known Allergies  Family History: Family History  Problem Relation Age of Onset   Diabetes Mother     Social History:  reports that he has quit smoking. His smoking use included e-cigarettes. He has never used smokeless tobacco. He reports current alcohol use. He reports that he does not use drugs.   Physical Exam: BP 127/85    Pulse 80    Ht 6' (1.829 m)    Wt 261 lb (118.4 kg)    BMI 35.40 kg/m   Constitutional:  Alert and oriented, No acute distress. HEENT: [place] AT, moist mucus membranes.  Trachea midline, no masses. Cardiovascular: No clubbing, cyanosis, or edema. Respiratory: Normal respiratory effort, no increased work of breathing. Skin: No rashes, bruises or suspicious lesions. Neurologic: Grossly intact, no focal deficits, moving all 4 extremities. Psychiatric: Normal mood and affect.  Laboratory Data: Lab Results  Component Value Date   CREATININE 1.29 (H) 11/02/2020   Lab Results  Component Value Date   PSA 2.1 07/08/2014   Lab Results  Component Value Date   HGBA1C 8.3 (A) 09/16/2021    Assessment & Plan:    Prostate cancer  - Low risk prostate cancer on active surveillance  - Discussed surgical pathology today and how it is consistent with previous biopsy.  -Strongly recommend continued active surveillance at this time - MR Butler; Future - PSA;future    2. BPH associated with nocturia - Previously tried Flomax without any improvement. Offered medication today but he declined.   3. Erectile dysfunction  - Bothersome  - Would like to try medication today  - Sildenafil prescribed  Return in 6 months for PSA   Conley Rolls as a scribe for [name], MD.,have documented all relevant documentation on the behalf of [name], MD,as directed by  [name], MD while in the presence of [name], MD.  I have reviewed the above documentation for  accuracy and completeness, and I agree with the above.   [name], MD   [facility] [address] [place], [place] [address] [phone]

## 2021-11-02 ENCOUNTER — Other Ambulatory Visit: Payer: Self-pay

## 2021-11-02 ENCOUNTER — Encounter: Payer: Self-pay | Admitting: Urology

## 2021-11-02 ENCOUNTER — Ambulatory Visit: Payer: Managed Care, Other (non HMO) | Admitting: Urology

## 2021-11-02 VITALS — BP 127/85 | HR 80 | Ht 72.0 in | Wt 261.0 lb

## 2021-11-02 DIAGNOSIS — R972 Elevated prostate specific antigen [PSA]: Secondary | ICD-10-CM

## 2021-11-02 DIAGNOSIS — C61 Malignant neoplasm of prostate: Secondary | ICD-10-CM | POA: Diagnosis not present

## 2021-11-02 DIAGNOSIS — R351 Nocturia: Secondary | ICD-10-CM

## 2021-11-02 DIAGNOSIS — N401 Enlarged prostate with lower urinary tract symptoms: Secondary | ICD-10-CM

## 2021-11-02 MED ORDER — SILDENAFIL CITRATE 20 MG PO TABS
ORAL_TABLET | ORAL | 2 refills | Status: AC
Start: 2021-11-02 — End: ?

## 2021-12-12 ENCOUNTER — Ambulatory Visit: Payer: Managed Care, Other (non HMO) | Admitting: Family Medicine

## 2022-03-28 ENCOUNTER — Encounter: Payer: Self-pay | Admitting: Family Medicine

## 2022-03-28 ENCOUNTER — Ambulatory Visit: Payer: Managed Care, Other (non HMO) | Admitting: Family Medicine

## 2022-03-28 VITALS — BP 119/72 | HR 81 | Temp 98.4°F | Resp 16 | Wt 262.0 lb

## 2022-03-28 DIAGNOSIS — Z125 Encounter for screening for malignant neoplasm of prostate: Secondary | ICD-10-CM | POA: Diagnosis not present

## 2022-03-28 DIAGNOSIS — E1121 Type 2 diabetes mellitus with diabetic nephropathy: Secondary | ICD-10-CM

## 2022-03-28 DIAGNOSIS — E785 Hyperlipidemia, unspecified: Secondary | ICD-10-CM | POA: Diagnosis not present

## 2022-03-28 LAB — POCT GLYCOSYLATED HEMOGLOBIN (HGB A1C)
Est. average glucose Bld gHb Est-mCnc: 306
Hemoglobin A1C: 12.3 % — AB (ref 4.0–5.6)

## 2022-03-28 MED ORDER — RYBELSUS 7 MG PO TABS: ORAL_TABLET | ORAL | Status: DC

## 2022-03-28 NOTE — Progress Notes (Signed)
I,Roshena L Chambers,acting as a scribe for Lelon Huh, MD.,have documented all relevant documentation on the behalf of Ronald Jurewicz, MD,as directed by  Lelon Huh, MD while in the presence of Lelon Huh, MD.    Established patient visit   Patient: Ronald Shah   DOB: 05/26/1961   61 y.o. Male  MRN: [id number] Visit Date: 03/28/2022  Today's healthcare provider: Lelon Huh, MD   Chief Complaint  Patient presents with   Diabetes   Hyperlipidemia   Subjective    HPI  Diabetes Mellitus Type II, Follow-up  Lab Results  Component Value Date   HGBA1C 8.3 (A) 09/16/2021   HGBA1C 10.0 (A) 06/10/2021   HGBA1C 9.4 (A) 02/11/2021   Wt Readings from Last 3 Encounters:  11/02/21 261 lb (118.4 kg)  09/16/21 261 lb 1.6 oz (118.4 kg)  09/02/21 253 lb (114.8 kg)   Last seen for diabetes 6 months ago.  Management since then includes doubling the dose of Rybelsus to Semaglutide (RYBELSUS) 7 MG TABS; Take 14 mg by mouth daily.  He reports poor compliance with treatment. Patient stopped taking Rybelsus several months ago to to it causing nausea and upset stomach.  He is having side effects. Nausea and upset stomach when taking Rybelsus.However, he states that he did not have any adverse effects taking the '7mg'$  tablets.  Symptoms: No fatigue No foot ulcerations  No appetite changes No nausea  No paresthesia of the feet  No polydipsia  No polyuria No visual disturbances   No vomiting     Home blood sugar records: blood sugars are not checked  Episodes of hypoglycemia? No    Current insulin regiment: none Most Recent Eye Exam: >1 year ago Current exercise: none Current diet habits: well balanced  Pertinent Labs: Lab Results  Component Value Date   CHOL 185 11/02/2020   HDL 46 11/02/2020   LDLCALC 105 (H) 11/02/2020   LDLDIRECT 72 03/26/2018   TRIG 196 (H) 11/02/2020   CHOLHDL 4.0 11/02/2020   Lab Results  Component Value Date   NA 138 11/02/2020   K 4.5  11/02/2020   CREATININE 1.29 (H) 11/02/2020   GFRNONAA 60 11/02/2020   MICROALBUR negative 02/11/2021     ---------------------------------------------------------------------------------------------------   Lipid/Cholesterol, Follow-up  Last lipid panel Other pertinent labs  Lab Results  Component Value Date   CHOL 185 11/02/2020   HDL 46 11/02/2020   LDLCALC 105 (H) 11/02/2020   LDLDIRECT 72 03/26/2018   TRIG 196 (H) 11/02/2020   CHOLHDL 4.0 11/02/2020   Lab Results  Component Value Date   ALT 23 11/02/2020   AST 15 11/02/2020   PLT 294 11/02/2020     He was last seen for this 1 year ago.  Management since that visit includes continue same medication.  He reports good compliance with treatment. He is not having side effects.   Symptoms: No chest pain No chest pressure/discomfort  No dyspnea No lower extremity edema  No numbness or tingling of extremity No orthopnea  No palpitations No paroxysmal nocturnal dyspnea  No speech difficulty No syncope   Current diet: well balanced Current exercise: none  The 10-year ASCVD risk score (Arnett DK, et al., 2019) is: 16.9%  Medications: Outpatient Medications Prior to Visit  Medication Sig   glipiZIDE (GLUCOTROL) 10 MG tablet TAKE 1 TABLET DAILY BEFORE BREAKFAST   metFORMIN (GLUCOPHAGE-XR) 500 MG 24 hr tablet TAKE 2 TABLETS TWICE A DAY   pravastatin (PRAVACHOL) 40 MG tablet Take 1  tablet (40 mg total) by mouth every evening.   Semaglutide (RYBELSUS) 14 MG TABS Take 14 mg by mouth daily.   sildenafil (REVATIO) 20 MG tablet Take 3-5 tablets 1 hour prior to intercourse as needed   No facility-administered medications prior to visit.    Review of Systems  Constitutional:  Negative for appetite change, chills and fever.  Respiratory:  Negative for chest tightness and wheezing.   Cardiovascular:  Negative for palpitations.  Gastrointestinal:  Negative for abdominal pain, nausea and vomiting.       Objective    BP  119/72 (BP Location: Left Arm, Patient Position: Sitting, Cuff Size: Large)   Pulse 81   Temp 98.4 F (36.9 C) (Oral)   Resp 16   Wt 262 lb (118.8 kg)   SpO2 98% Comment: room air  BMI 35.53 kg/m    Physical Exam   General: Appearance:    Obese male in no acute distress  Eyes:    PERRL, conjunctiva/corneas clear, EOM's intact       Lungs:     Clear to auscultation bilaterally, respirations unlabored  Heart:    Normal heart rate. Normal rhythm. No murmurs, rubs, or gallops.    MS:   All extremities are intact.    Neurologic:   Awake, alert, oriented x 3. No apparent focal neurological defect.         Results for orders placed or performed in visit on 03/28/22  POCT HgB A1C  Result Value Ref Range   Hemoglobin A1C 12.3 (A) 4.0 - 5.6 %   Est. average glucose Bld gHb Est-mCnc 306     Assessment & Plan     1. Diabetes mellitus with nephropathy ([place]) Uncontrolled due to patient stopping Rybelsus secondary to side effects. He previously tolerated lower dose of '7mg'$  daily which had his a1c at 8.3. discussed starting back on lower dose versus trial of Mounjaro. He prefers to go back to lower dose of Rybelsus. He still has '7mg'$  tablets left, so will start by taking 1/2 tablet a day for a month then increase to a full tablet daily. Is to call if any adverse effects.  - Urine microalbumin-creatinine with uACR  2. Hyperlipidemia, unspecified hyperlipidemia type He is tolerating pravastatin well with no adverse effects.   - CBC - Comprehensive metabolic panel - Lipid panel  3. Prostate cancer screening  - PSA Total (Reflex To Free) ([facility] only)      The entirety of the information documented in the History of Present Illness, Review of Systems and Physical Exam were personally obtained by me. Portions of this information were initially documented by the CMA and reviewed by me for thoroughness and accuracy.     Lelon Huh, MD  [facility] [phone]  (phone) [phone] (fax)  [place]

## 2022-03-28 NOTE — Patient Instructions (Addendum)
Please review the attached list of medications and notify my office if there are any errors.   Please go to the lab draw station in [address] on the second floor of [facility]  when you are fasting for 8 hours. Normal hours are 8:00am to 11:30am and 1:00pm to 4:00pm Monday through Friday   Please contact your eyecare professional to schedule a routine eye exam   Start back on Rybelsus '7mg'$  tablets by taking 1/2 tablet once a day for 1 month, then increase to 1 tablet daily

## 2022-03-29 LAB — MICROALBUMIN / CREATININE URINE RATIO
Creatinine, Urine: 134.1 mg/dL
Microalb/Creat Ratio: 37 mg/g creat — ABNORMAL HIGH (ref 0–29)
Microalbumin, Urine: 49.9 ug/mL

## 2022-04-25 ENCOUNTER — Other Ambulatory Visit: Payer: Self-pay | Admitting: Family Medicine

## 2022-05-02 ENCOUNTER — Other Ambulatory Visit: Payer: Managed Care, Other (non HMO)

## 2022-05-02 DIAGNOSIS — C61 Malignant neoplasm of prostate: Secondary | ICD-10-CM

## 2022-05-03 LAB — PSA: Prostate Specific Ag, Serum: 6 ng/mL — ABNORMAL HIGH (ref 0.0–4.0)

## 2022-05-09 ENCOUNTER — Ambulatory Visit: Payer: Managed Care, Other (non HMO) | Admitting: Urology

## 2022-05-09 ENCOUNTER — Encounter: Payer: Self-pay | Admitting: Urology

## 2022-05-09 VITALS — BP 124/84 | HR 84 | Ht 72.0 in | Wt 259.0 lb

## 2022-05-09 DIAGNOSIS — Z8546 Personal history of malignant neoplasm of prostate: Secondary | ICD-10-CM

## 2022-05-09 DIAGNOSIS — R351 Nocturia: Secondary | ICD-10-CM

## 2022-05-09 DIAGNOSIS — R972 Elevated prostate specific antigen [PSA]: Secondary | ICD-10-CM

## 2022-05-09 DIAGNOSIS — N401 Enlarged prostate with lower urinary tract symptoms: Secondary | ICD-10-CM | POA: Diagnosis not present

## 2022-05-09 NOTE — Progress Notes (Signed)
05/09/22 11:51 AM   Ronald Shah 30-Oct-1960 [id number]  Referring provider:  Birdie Sons, [place],  [place] [address] Chief Complaint  Patient presents with   Prostate Cancer    24mofollow up     HPI: Ronald ZWAHLENis a 61y.o.male with a personal history low risk prostate cancer, BPH with associated nocturia, and ED who presents today for a 6 month follow-up with PSA.   On 07/22/2019 he underwent a prostate biopsy.The 12 core biopsy revealed 1 of 3 cores with Gleason 3 + 3.Affecting 10% at the right lateral apex. There was also a suspicious area at the right lateral mid but not diagnostic for prostate cancer. There were other areas of acute and chronic inflammation within the specimen. TRUS volume 61.2 cc.   Prostate MRI and 12/21 visualized 2 lesion PIRADS category 4 lesion in anterior right transitional zone at the apex.  He underwent a fusion biopsy on 10/26/2021 surgical pathology was consistent with Gleason 3 +3 involving Prostate, ROI, MRI lesion, right 4 samples, and 3 cores affecting up to 20 %, and high grade prostatic intraepithelial neoplasia involving 2 cores.  His most recent PSA on 05/02/2022 was 6.0.   Managed on sildenafil for ED.   He is doing well with no new urinary complaints.   PMH: Past Medical History:  Diagnosis Date   History of chicken pox    Prostate cancer ([facility]     Surgical History: No past surgical history on file.  Home Medications:  Allergies as of 05/09/2022   No Known Allergies      Medication List        Accurate as of May 09, 2022 11:51 AM. If you have any questions, ask your nurse or doctor.          glipiZIDE 10 MG tablet Commonly known as: GLUCOTROL TAKE 1 TABLET DAILY BEFORE BREAKFAST   metFORMIN 500 MG 24 hr tablet Commonly known as: GLUCOPHAGE-XR TAKE 2 TABLETS TWICE A DAY   pravastatin 40 MG tablet Commonly known as: PRAVACHOL TAKE 1 TABLET BY MOUTH EVERY EVENING.    Rybelsus 7 MG Tabs Generic drug: Semaglutide 1/2 tablet daily for 1 months, then increase to 1 tablet daily   sildenafil 20 MG tablet Commonly known as: REVATIO Take 3-5 tablets 1 hour prior to intercourse as needed        Allergies:  No Known Allergies  Family History: Family History  Problem Relation Age of Onset   Diabetes Mother     Social History:  reports that he has quit smoking. His smoking use included e-cigarettes. He has never used smokeless tobacco. He reports current alcohol use. He reports that he does not use drugs.   Physical Exam: BP 124/84   Pulse 84   Ht 6' (1.829 m)   Wt 259 lb (117.5 kg)   BMI 35.13 kg/m   Constitutional:  Alert and oriented, No acute distress. HEENT: [place] AT, moist mucus membranes.  Trachea midline, no masses. Cardiovascular: No clubbing, cyanosis, or edema. Respiratory: Normal respiratory effort, no increased work of breathing. Skin: No rashes, bruises or suspicious lesions. Neurologic: Grossly intact, no focal deficits, moving all 4 extremities. Psychiatric: Normal mood and affect.  Laboratory Data:  Lab Results  Component Value Date   CREATININE 1.29 (H) 11/02/2020   Lab Results  Component Value Date   HGBA1C 12.3 (A) 03/28/2022    Assessment & Plan:   Prostate cancer  -  Low risk prostate cancer on active surveillance  -PSA / DRE in 6 mo - PSA stable  - PSA;future   2. BPH associated with nocturia - Stable  -Previously declined meds -Discussed Urolift today in detail including risk, benefits.  Literature given.  He will let us know if he is interested down the road.    3. Erectile dysfunction  - Continue sildenafil   F/u 6 mo PSA/ DRE  Guy Sandifer Samael Blades,acting as a scribe for [name], MD.,have documented all relevant documentation on the behalf of [name], MD,as directed by  [name], MD while in the presence of [name], MD.  I have reviewed the above documentation for accuracy and  completeness, and I agree with the above.   [name], MD   [facility] [address], [place], [place] [address] [phone]

## 2022-06-15 ENCOUNTER — Other Ambulatory Visit: Payer: Self-pay | Admitting: Family Medicine

## 2022-07-03 ENCOUNTER — Other Ambulatory Visit: Payer: Self-pay | Admitting: Family Medicine

## 2022-07-27 ENCOUNTER — Telehealth: Payer: Self-pay | Admitting: Family Medicine

## 2022-07-27 DIAGNOSIS — E1121 Type 2 diabetes mellitus with diabetic nephropathy: Secondary | ICD-10-CM

## 2022-07-27 NOTE — Telephone Encounter (Signed)
Requested medication (s) are due for refill today: no  Requested medication (s) are on the active medication list: no  Last refill:  09/21/21   Future visit scheduled: yes  Notes to clinic:  off protocol- dc'd 03/28/22 NT not delegated to refuse this med   Requested Prescriptions  Pending Prescriptions Disp Refills   RYBELSUS 14 MG TABS [Pharmacy Med Name: RYBELSUS 14 MG TAB] 30 tablet 4    Sig: TAKE ONE TABLET (14 MG) BY MOUTH EVERY DAY     Off-Protocol Failed - 07/27/2022  2:52 PM      Failed - Medication not assigned to a protocol, review manually.      Passed - Valid encounter within last 12 months    Recent Outpatient Visits           4 months ago Diabetes mellitus with nephropathy [facility])   [facility] Birdie Sons, MD   10 months ago Diabetes mellitus with nephropathy [facility])   [facility] Birdie Sons, MD   1 year ago Diabetes mellitus with nephropathy [facility])   [facility] Birdie Sons, MD   1 year ago Diabetes mellitus with nephropathy [facility])   [facility] Birdie Sons, MD   1 year ago Annual physical exam   [facility] Birdie Sons, MD       Future Appointments             In 4 days Fisher, Kirstie Peri, MD [facility] [place]   In 3 months [name], MD [place]

## 2022-07-28 LAB — COMPREHENSIVE METABOLIC PANEL
ALT: 38 IU/L (ref 0–44)
AST: 22 IU/L (ref 0–40)
Albumin/Globulin Ratio: 1.7 (ref 1.2–2.2)
Albumin: 4.3 g/dL (ref 3.9–4.9)
Alkaline Phosphatase: 62 IU/L (ref 44–121)
BUN/Creatinine Ratio: 16 (ref 10–24)
BUN: 19 mg/dL (ref 8–27)
Bilirubin Total: 0.6 mg/dL (ref 0.0–1.2)
CO2: 23 mmol/L (ref 20–29)
Calcium: 9.5 mg/dL (ref 8.6–10.2)
Chloride: 101 mmol/L (ref 96–106)
Creatinine, Ser: 1.18 mg/dL (ref 0.76–1.27)
Globulin, Total: 2.5 g/dL (ref 1.5–4.5)
Glucose: 187 mg/dL — ABNORMAL HIGH (ref 70–99)
Potassium: 4.9 mmol/L (ref 3.5–5.2)
Sodium: 138 mmol/L (ref 134–144)
Total Protein: 6.8 g/dL (ref 6.0–8.5)
eGFR: 70 mL/min/{1.73_m2} (ref >59)

## 2022-07-28 LAB — LIPID PANEL
Chol/HDL Ratio: 2.8 ratio (ref 0.0–5.0)
Cholesterol, Total: 135 mg/dL (ref 100–199)
HDL: 49 mg/dL (ref >39)
LDL Chol Calc (NIH): 72 mg/dL (ref 0–99)
Triglycerides: 70 mg/dL (ref 0–149)
VLDL Cholesterol Cal: 14 mg/dL (ref 5–40)

## 2022-07-28 LAB — PSA TOTAL (REFLEX TO FREE): Prostate Specific Ag, Serum: 5.9 ng/mL — ABNORMAL HIGH (ref 0.0–4.0)

## 2022-07-28 LAB — FPSA% REFLEX
% FREE PSA: 18.8 %
PSA, FREE: 1.11 ng/mL

## 2022-07-28 LAB — CBC
Hematocrit: 45.9 % (ref 37.5–51.0)
Hemoglobin: 15.7 g/dL (ref 13.0–17.7)
MCH: 31.7 pg (ref 26.6–33.0)
MCHC: 34.2 g/dL (ref 31.5–35.7)
MCV: 93 fL (ref 79–97)
Platelets: 247 10*3/uL (ref 150–450)
RBC: 4.96 x10E6/uL (ref 4.14–5.80)
RDW: 12.4 % (ref 11.6–15.4)
WBC: 7.1 10*3/uL (ref 3.4–10.8)

## 2022-07-30 NOTE — Telephone Encounter (Signed)
Please check with patient regarding dose of Rybelsus he is taking. I thought we had reduced does to '7mg'$  due to side effects, but refill request is for '14mg'$ ... if taking 14 then make sure he is tolerating well before refilling

## 2022-07-31 ENCOUNTER — Ambulatory Visit (INDEPENDENT_AMBULATORY_CARE_PROVIDER_SITE_OTHER): Payer: Managed Care, Other (non HMO) | Admitting: Family Medicine

## 2022-07-31 ENCOUNTER — Encounter: Payer: Self-pay | Admitting: Family Medicine

## 2022-07-31 VITALS — BP 113/66 | HR 75 | Ht 72.0 in | Wt 257.0 lb

## 2022-07-31 DIAGNOSIS — E1121 Type 2 diabetes mellitus with diabetic nephropathy: Secondary | ICD-10-CM | POA: Diagnosis not present

## 2022-07-31 DIAGNOSIS — Z1211 Encounter for screening for malignant neoplasm of colon: Secondary | ICD-10-CM

## 2022-07-31 LAB — POCT GLYCOSYLATED HEMOGLOBIN (HGB A1C)
Est. average glucose Bld gHb Est-mCnc: 214
Hemoglobin A1C: 9.1 % — AB (ref 4.0–5.6)

## 2022-07-31 MED ORDER — RYBELSUS 14 MG PO TABS: 14.0000 mg | ORAL_TABLET | Freq: Every day | ORAL | 1 refills | Status: DC

## 2022-07-31 NOTE — Progress Notes (Signed)
I,Roshena L Chambers,acting as a scribe for Lelon Huh, MD.,have documented all relevant documentation on the behalf of Odean Fester, MD,as directed by  Lelon Huh, MD while in the presence of Lelon Huh, MD.    Established patient visit   Patient: Ronald Shah   DOB: Feb 13, 1961   61 y.o. Male  MRN: [id number] Visit Date: 07/31/2022  Today's healthcare provider: Lelon Huh, MD   Chief Complaint  Patient presents with   Diabetes   Subjective    HPI  Diabetes Mellitus Type II, Follow-up  Lab Results  Component Value Date   HGBA1C 9.1 (A) 07/31/2022   HGBA1C 12.3 (A) 03/28/2022   HGBA1C 8.3 (A) 09/16/2021   Wt Readings from Last 3 Encounters:  07/31/22 257 lb (116.6 kg)  05/09/22 259 lb (117.5 kg)  03/28/22 262 lb (118.8 kg)   Last seen for diabetes 4 months ago.  Management since then includes restarting lower dose of Rybelsus. He reports good compliance with treatment. Currently taking 76m daily of Rybelsus. He is not having side effects.  Symptoms: No fatigue No foot ulcerations  No appetite changes No nausea  No paresthesia of the feet  No polydipsia  No polyuria No visual disturbances   No vomiting     Home blood sugar records: not checked regularly  Episodes of hypoglycemia? No    Current insulin regiment: none Most Recent Eye Exam: not UTD   Pertinent Labs: Lab Results  Component Value Date   CHOL 135 07/27/2022   HDL 49 07/27/2022   LDLCALC 72 07/27/2022   LDLDIRECT 72 03/26/2018   TRIG 70 07/27/2022   CHOLHDL 2.8 07/27/2022   Lab Results  Component Value Date   NA 138 07/27/2022   K 4.9 07/27/2022   CREATININE 1.18 07/27/2022   EGFR 70 07/27/2022   MICROALBUR negative 02/11/2021   LABMICR 49.9 03/28/2022     ---------------------------------------------------------------------------------------------------   Medications: Outpatient Medications Prior to Visit  Medication Sig   RYBELSUS 14 MG TABS TAKE ONE TABLET (14  MG) BY MOUTH EVERY DAY   glipiZIDE (GLUCOTROL) 10 MG tablet TAKE 1 TABLET DAILY BEFORE BREAKFAST   metFORMIN (GLUCOPHAGE-XR) 500 MG 24 hr tablet TAKE 2 TABLETS TWICE A DAY   pravastatin (PRAVACHOL) 40 MG tablet TAKE 1 TABLET BY MOUTH EVERY EVENING.   sildenafil (REVATIO) 20 MG tablet Take 3-5 tablets 1 hour prior to intercourse as needed   No facility-administered medications prior to visit.    Review of Systems  Constitutional:  Negative for appetite change, chills and fever.  Respiratory:  Negative for chest tightness, shortness of breath and wheezing.   Cardiovascular:  Negative for chest pain and palpitations.  Gastrointestinal:  Negative for abdominal pain, nausea and vomiting.       Objective    BP 113/66   Pulse 75   Ht 6' (1.829 m)   Wt 257 lb (116.6 kg)   BMI 34.86 kg/m    Physical Exam  General appearance: Mildly obese male, cooperative and in no acute distress Head: Normocephalic, without obvious abnormality, atraumatic Respiratory: Respirations even and unlabored, normal respiratory rate Extremities: All extremities are intact.  Skin: Skin color, texture, turgor normal. No rashes seen  Psych: Appropriate mood and affect. Neurologic: Mental status: Alert, oriented to person, place, and time, thought content appropriate.   Results for orders placed or performed in visit on 07/31/22  POCT HgB A1C  Result Value Ref Range   Hemoglobin A1C 9.1 (A) 4.0 - 5.6 %  Est. average glucose Bld gHb Est-mCnc 214     Assessment & Plan     1. Diabetes mellitus with nephropathy (HCC) Is titrating titration of Rybelsus to 53m. A1c improving but not to goal. Discussed adding additional medications. Have heard that higher doses of Rybelsus may be available in the future. Will continue same medications for now and recheck - Semaglutide (RYBELSUS) 14 MG TABS; Take 1 tablet (14 mg total) by mouth daily.  Dispense: 90 tablet; Refill: 1  Follow up 3 months.   2. Colon cancer  screening  - Ambulatory referral to gastroenterology for colonoscopy      The entirety of the information documented in the History of Present Illness, Review of Systems and Physical Exam were personally obtained by me. Portions of this information were initially documented by the CMA and reviewed by me for thoroughness and accuracy.     DLelon Huh MD  [phone](phone) [phone](fax)  [place]

## 2022-07-31 NOTE — Telephone Encounter (Signed)
Spoke with patient and he says the '14mg'$  Rybelsus seems to be doing ok.  Rx sent in.

## 2022-08-02 ENCOUNTER — Other Ambulatory Visit: Payer: Self-pay

## 2022-08-02 ENCOUNTER — Telehealth: Payer: Self-pay

## 2022-08-02 DIAGNOSIS — Z1211 Encounter for screening for malignant neoplasm of colon: Secondary | ICD-10-CM

## 2022-08-02 MED ORDER — NA SULFATE-K SULFATE-MG SULF 17.5-3.13-1.6 GM/177ML PO SOLN: 1.0000 | Freq: Once | ORAL | 0 refills | Status: AC

## 2022-08-02 NOTE — Telephone Encounter (Signed)
Gastroenterology Pre-Procedure Review  Request Date: 12/04 Requesting Physician: Dr. Vicente Males  PATIENT REVIEW QUESTIONS: The patient responded to the following health history questions as indicated:    1. Are you having any GI issues? no 2. Do you have a personal history of Polyps? no 3. Do you have a family history of Colon Cancer or Polyps? no 4. Diabetes Mellitus? yes (Advised to hold Metformin 2 days prior to colonoscopy.  Glipizide and Semaglutide 1 day hold) 5. Joint replacements in the past 12 months?no 6. Major health problems in the past 3 months?no 7. Any artificial heart valves, MVP, or defibrillator?no    MEDICATIONS & ALLERGIES:    Patient reports the following regarding taking any anticoagulation/antiplatelet therapy:   Plavix, Coumadin, Eliquis, Xarelto, Lovenox, Pradaxa, Brilinta, or Effient? no Aspirin? no  Patient confirms/reports the following medications:  Current Outpatient Medications  Medication Sig Dispense Refill   glipiZIDE (GLUCOTROL) 10 MG tablet TAKE 1 TABLET DAILY BEFORE BREAKFAST 90 tablet 3   metFORMIN (GLUCOPHAGE-XR) 500 MG 24 hr tablet TAKE 2 TABLETS TWICE A DAY 360 tablet 0   pravastatin (PRAVACHOL) 40 MG tablet TAKE 1 TABLET BY MOUTH EVERY EVENING. 90 tablet 2   Semaglutide (RYBELSUS) 14 MG TABS Take 1 tablet (14 mg total) by mouth daily. 90 tablet 1   sildenafil (REVATIO) 20 MG tablet Take 3-5 tablets 1 hour prior to intercourse as needed 60 tablet 2   No current facility-administered medications for this visit.    Patient confirms/reports the following allergies:  No Known Allergies  No orders of the defined types were placed in this encounter.   AUTHORIZATION INFORMATION Primary Insurance: 1D#: Group #:  Secondary Insurance: 1D#: Group #:  SCHEDULE INFORMATION: Date: 08/21/22 Time: Location: [facility]

## 2022-08-18 ENCOUNTER — Encounter: Payer: Self-pay | Admitting: Gastroenterology

## 2022-08-21 ENCOUNTER — Ambulatory Visit
Admission: RE | Admit: 2022-08-21 | Discharge: 2022-08-21 | Disposition: A | Discharge status: Home / Self Care | Payer: Managed Care, Other (non HMO) | Attending: Gastroenterology | Admitting: Gastroenterology

## 2022-08-21 ENCOUNTER — Ambulatory Visit: Payer: Managed Care, Other (non HMO) | Admitting: Certified Registered Nurse Anesthetist

## 2022-08-21 ENCOUNTER — Encounter
Admission: RE | Disposition: A | Discharge status: Home / Self Care | Payer: Self-pay | Source: Home / Self Care | Attending: Gastroenterology

## 2022-08-21 ENCOUNTER — Encounter: Payer: Self-pay | Admitting: Gastroenterology

## 2022-08-21 DIAGNOSIS — Z1211 Encounter for screening for malignant neoplasm of colon: Secondary | ICD-10-CM | POA: Diagnosis present

## 2022-08-21 DIAGNOSIS — D120 Benign neoplasm of cecum: Secondary | ICD-10-CM | POA: Diagnosis not present

## 2022-08-21 DIAGNOSIS — E785 Hyperlipidemia, unspecified: Secondary | ICD-10-CM | POA: Diagnosis not present

## 2022-08-21 DIAGNOSIS — D126 Benign neoplasm of colon, unspecified: Secondary | ICD-10-CM | POA: Diagnosis not present

## 2022-08-21 DIAGNOSIS — E119 Type 2 diabetes mellitus without complications: Secondary | ICD-10-CM | POA: Insufficient documentation

## 2022-08-21 DIAGNOSIS — Z7984 Long term (current) use of oral hypoglycemic drugs: Secondary | ICD-10-CM | POA: Insufficient documentation

## 2022-08-21 DIAGNOSIS — Z87891 Personal history of nicotine dependence: Secondary | ICD-10-CM | POA: Diagnosis not present

## 2022-08-21 DIAGNOSIS — D122 Benign neoplasm of ascending colon: Secondary | ICD-10-CM | POA: Diagnosis not present

## 2022-08-21 DIAGNOSIS — D124 Benign neoplasm of descending colon: Secondary | ICD-10-CM | POA: Diagnosis not present

## 2022-08-21 DIAGNOSIS — Z8546 Personal history of malignant neoplasm of prostate: Secondary | ICD-10-CM | POA: Insufficient documentation

## 2022-08-21 HISTORY — PX: COLONOSCOPY WITH PROPOFOL: SHX5780

## 2022-08-21 LAB — GLUCOSE, CAPILLARY: Glucose-Capillary: 141 mg/dL — ABNORMAL HIGH (ref 70–99)

## 2022-08-21 SURGERY — COLONOSCOPY WITH PROPOFOL
Anesthesia: General

## 2022-08-21 MED ORDER — LIDOCAINE HCL (CARDIAC) PF 100 MG/5ML IV SOSY
PREFILLED_SYRINGE | INTRAVENOUS | Status: DC | PRN
  Administered 2022-08-21: 50 mg via INTRAVENOUS

## 2022-08-21 MED ORDER — PROPOFOL 500 MG/50ML IV EMUL
INTRAVENOUS | Status: DC | PRN
  Administered 2022-08-21: 160 ug/kg/min via INTRAVENOUS

## 2022-08-21 MED ORDER — STERILE WATER FOR IRRIGATION IR SOLN
Status: DC | PRN
  Administered 2022-08-21: 120 mL

## 2022-08-21 MED ORDER — SODIUM CHLORIDE 0.9 % IV SOLN: INTRAVENOUS | Status: DC

## 2022-08-21 MED ORDER — PROPOFOL 10 MG/ML IV BOLUS
INTRAVENOUS | Status: DC | PRN
  Administered 2022-08-21: 10 mg via INTRAVENOUS
  Administered 2022-08-21: 70 mg via INTRAVENOUS

## 2022-08-21 NOTE — Op Note (Signed)
[facility] Patient Name: Ronald Shah Procedure Date: 08/21/2022 8:04 AM MRN: [id number] Account #: [id number] Date of Birth: 1961-02-09 Admit Type: Outpatient Age: 61 Room: [facility] ENDO ROOM 1 Gender: Male Note Status: Finalized Instrument Name: Jasper Riling [id number] Procedure:             Colonoscopy Indications:           Screening for colorectal malignant neoplasm Providers:             Jonathon Bellows MD, MD Referring MD:          Kirstie Peri. Caryn Section, MD (Referring MD) Medicines:             Monitored Anesthesia Care Complications:         No immediate complications. Procedure:             Pre-Anesthesia Assessment:                        - Prior to the procedure, a History and Physical was                         performed, and patient medications, allergies and                         sensitivities were reviewed. The patient's tolerance                         of previous anesthesia was reviewed.                        - The risks and benefits of the procedure and the                         sedation options and risks were discussed with the                         patient. All questions were answered and informed                         consent was obtained.                        - ASA Grade Assessment: II - A patient with mild                         systemic disease.                        After obtaining informed consent, the colonoscope was                         passed under direct vision. Throughout the procedure,                         the patient's blood pressure, pulse, and oxygen                         saturations were monitored continuously. The                         Colonoscope was  introduced through the anus and                         advanced to the the cecum, identified by the                         appendiceal orifice. The colonoscopy was performed                         with ease. The patient tolerated the procedure well.                          The quality of the bowel preparation was excellent.                         The appendiceal orifice was photographed. Findings:      The perianal and digital rectal examinations were normal.      Three sessile polyps were found in the descending colon, ascending colon       and cecum. The polyps were 5 to 7 mm in size. These polyps were removed       with a cold snare. Resection and retrieval were complete.      The exam was otherwise without abnormality on direct and retroflexion       views. Impression:            - Three 5 to 7 mm polyps in the descending colon, in                         the ascending colon and in the cecum, removed with a                         cold snare. Resected and retrieved.                        - The examination was otherwise normal on direct and                         retroflexion views. Recommendation:        - Discharge patient to home (with escort).                        - Resume previous diet.                        - Continue present medications.                        - Await pathology results.                        - Repeat colonoscopy for surveillance based on                         pathology results. Procedure Code(s):     --- Professional ---                        [id number], Colonoscopy, flexible; with removal of  tumor(s), polyp(s), or other lesion(s) by snare                         technique Diagnosis Code(s):     --- Professional ---                        Z12.11, Encounter for screening for malignant neoplasm                         of colon                        D12.4, Benign neoplasm of descending colon                        D12.2, Benign neoplasm of ascending colon                        D12.0, Benign neoplasm of cecum CPT copyright 2022 American Medical Association. All rights reserved. The codes documented in this report are preliminary and upon coder review may  be revised to meet  current compliance requirements. Jonathon Bellows, MD Jonathon Bellows MD, MD 08/21/2022 8:35:20 AM This report has been signed electronically. Number of Addenda: 0 Note Initiated On: 08/21/2022 8:04 AM Scope Withdrawal Time: 0 hours 13 minutes 8 seconds  Total Procedure Duration: 0 hours 15 minutes 12 seconds  Estimated Blood Loss:  Estimated blood loss: none.      [facility]

## 2022-08-21 NOTE — Anesthesia Procedure Notes (Signed)
Date/Time: 08/21/2022 8:14 AM  Performed by: Lily Peer, Chibueze Beasley, CRNAPre-anesthesia Checklist: Patient identified, Emergency Drugs available, Suction available, Patient being monitored and Timeout performed Patient Re-evaluated:Patient Re-evaluated prior to induction Oxygen Delivery Method: Nasal cannula Induction Type: IV induction

## 2022-08-21 NOTE — Anesthesia Preprocedure Evaluation (Signed)
Anesthesia Evaluation  Patient identified by MRN, date of birth, ID band Patient awake    Reviewed: Allergy & Precautions, NPO status , Patient's Chart, lab work & pertinent test results  History of Anesthesia Complications Negative for: history of anesthetic complications  Airway Mallampati: III  TM Distance: >3 FB Neck ROM: full    Dental  (+) Upper Dentures, Lower Dentures   Pulmonary neg pulmonary ROS, former smoker   Pulmonary exam normal        Cardiovascular negative cardio ROS Normal cardiovascular exam     Neuro/Psych negative neurological ROS  negative psych ROS   GI/Hepatic negative GI ROS, Neg liver ROS,,,  Endo/Other  negative endocrine ROSdiabetes, Type 2, Oral Hypoglycemic Agents    Renal/GU Renal disease  negative genitourinary   Musculoskeletal   Abdominal   Peds  Hematology negative hematology ROS (+)   Anesthesia Other Findings Past Medical History: No date: Diabetes mellitus without complication (HCC) No date: History of chicken pox No date: Hyperlipidemia No date: Prostate cancer ([place])  Past Surgical History: No date: COLONOSCOPY  BMI    Body Mass Index: 34.85 kg/m      Reproductive/Obstetrics negative OB ROS                             Anesthesia Physical Anesthesia Plan  ASA: 2  Anesthesia Plan: General   Post-op Pain Management: Minimal or no pain anticipated   Induction: Intravenous  PONV Risk Score and Plan: Propofol infusion and TIVA  Airway Management Planned: Natural Airway and Nasal Cannula  Additional Equipment:   Intra-op Plan:   Post-operative Plan:   Informed Consent: I have reviewed the patients History and Physical, chart, labs and discussed the procedure including the risks, benefits and alternatives for the proposed anesthesia with the patient or authorized representative who has indicated his/her understanding and acceptance.      Dental Advisory Given  Plan Discussed with: Anesthesiologist, CRNA and Surgeon  Anesthesia Plan Comments: (Patient consented for risks of anesthesia including but not limited to:  - adverse reactions to medications - risk of airway placement if required - damage to eyes, teeth, lips or other oral mucosa - nerve damage due to positioning  - sore throat or hoarseness - Damage to heart, brain, nerves, lungs, other parts of body or loss of life  Patient voiced understanding.)       Anesthesia Quick Evaluation

## 2022-08-21 NOTE — Transfer of Care (Signed)
Immediate Anesthesia Transfer of Care Note  Patient: Ronald Shah  Procedure(s) Performed: COLONOSCOPY WITH PROPOFOL  Patient Location: Endoscopy Unit  Anesthesia Type:General  Level of Consciousness: drowsy  Airway & Oxygen Therapy: Patient Spontanous Breathing  Post-op Assessment: Report given to RN and Post -op Vital signs reviewed and stable  Post vital signs: Reviewed and stable  Last Vitals:  Vitals Value Taken Time  BP 92/55 08/21/22 0835  Temp 35.6 C 08/21/22 0835  Pulse 68 08/21/22 0835  Resp 16 08/21/22 0835  SpO2 97 % 08/21/22 0835  Vitals shown include unvalidated device data.  Last Pain:  Vitals:   08/21/22 0835  TempSrc: Temporal  PainSc: Asleep         Complications: No notable events documented.

## 2022-08-21 NOTE — H&P (Signed)
Ronald Bellows, MD [address] [place], [place], [place], [address] [id number], [place], [place], [place], [address] Phone: [phone]  Fax: [phone]  Primary Care Physician:  Ronald Sons, MD   Pre-Procedure History & Physical: HPI:  Ronald Shah is a 61 y.o. male is here for an colonoscopy.   Past Medical History:  Diagnosis Date   Diabetes mellitus without complication ([place])    History of chicken pox    Hyperlipidemia    Prostate cancer [facility])     Past Surgical History:  Procedure Laterality Date   COLONOSCOPY      Prior to Admission medications   Medication Sig Start Date End Date Taking? Authorizing Provider  glipiZIDE (GLUCOTROL) 10 MG tablet TAKE 1 TABLET DAILY BEFORE BREAKFAST 07/03/22  Yes Ronald Sons, MD  metFORMIN (GLUCOPHAGE-XR) 500 MG 24 hr tablet TAKE 2 TABLETS TWICE A DAY 06/15/22  Yes Ronald Sons, MD  pravastatin (PRAVACHOL) 40 MG tablet TAKE 1 TABLET BY MOUTH EVERY EVENING. 04/26/22  Yes Ronald Sons, MD  Semaglutide (RYBELSUS) 14 MG TABS Take 1 tablet (14 mg total) by mouth daily. 07/31/22  Yes Ronald Sons, MD  sildenafil (REVATIO) 20 MG tablet Take 3-5 tablets 1 hour prior to intercourse as needed 11/02/21   [name], MD    Allergies as of 08/02/2022   (No Known Allergies)    Family History  Problem Relation Age of Onset   Diabetes Mother     Social History   Socioeconomic History   Marital status: Married    Spouse name: Not on file   Number of children: 5   Years of education: Not on file   Highest education level: Not on file  Occupational History   Occupation: [employer]  Tobacco Use   Smoking status: Former    Types: E-cigarettes   Smokeless tobacco: Never   Tobacco comments:    smoked 1 ppd for 20-25 yrs quit cigarettes in 2005  Vaping Use   Vaping Use: Former  Substance and Sexual Activity   Alcohol use: Yes    Comment: drinks 2-3 times a week   Drug use: No   Sexual activity: Not on  file  Other Topics Concern   Not on file  Social History Narrative   Not on file   Social Determinants of Health   Financial Resource Strain: Not on file  Food Insecurity: Not on file  Transportation Needs: Not on file  Physical Activity: Not on file  Stress: Not on file  Social Connections: Not on file  Intimate Partner Violence: Not on file    Review of Systems: See HPI, otherwise negative ROS  Physical Exam: BP 112/82   Pulse 66   Temp (!) 96 F (35.6 C) (Axillary)   Ht 5' 11.5" (1.816 m)   Wt 114.9 kg   SpO2 100%   BMI 34.85 kg/m  General:   Alert,  pleasant and cooperative in NAD Head:  Normocephalic and atraumatic. Neck:  Supple; no masses or thyromegaly. Lungs:  Clear throughout to auscultation, normal respiratory effort.    Heart:  +S1, +S2, Regular rate and rhythm, No edema. Abdomen:  Soft, nontender and nondistended. Normal bowel sounds, without guarding, and without rebound.   Neurologic:  Alert and  oriented x4;  grossly normal neurologically.  Impression/Plan: Ronald Shah is here for an colonoscopy to be performed for Screening colonoscopy average risk   Risks, benefits, limitations, and alternatives regarding  colonoscopy  have been reviewed with the patient.  Questions have been answered.  All parties agreeable.   Ronald Bellows, MD  08/21/2022, 8:03 AM

## 2022-08-21 NOTE — Anesthesia Postprocedure Evaluation (Signed)
Anesthesia Post Note  Patient: Ronald Shah  Procedure(s) Performed: COLONOSCOPY WITH PROPOFOL  Patient location during evaluation: Endoscopy Anesthesia Type: General Level of consciousness: awake and alert Pain management: pain level controlled Vital Signs Assessment: post-procedure vital signs reviewed and stable Respiratory status: spontaneous breathing, nonlabored ventilation, respiratory function stable and patient connected to nasal cannula oxygen Cardiovascular status: blood pressure returned to baseline and stable Postop Assessment: no apparent nausea or vomiting Anesthetic complications: no  No notable events documented.   Last Vitals:  Vitals:   08/21/22 0734 08/21/22 0835  BP: 112/82 (!) 92/55  Pulse: 66   Temp: (!) 35.6 C (!) 35.6 C  SpO2: 100% 97%    Last Pain:  Vitals:   08/21/22 0835  TempSrc: Temporal  PainSc: Asleep                 Ilene Qua

## 2022-08-22 ENCOUNTER — Encounter: Payer: Self-pay | Admitting: Gastroenterology

## 2022-08-22 LAB — SURGICAL PATHOLOGY

## 2022-08-28 ENCOUNTER — Encounter: Payer: Self-pay | Admitting: Gastroenterology

## 2022-09-13 ENCOUNTER — Other Ambulatory Visit: Payer: Self-pay | Admitting: Family Medicine

## 2022-09-13 NOTE — Telephone Encounter (Signed)
Requested Prescriptions  Pending Prescriptions Disp Refills   metFORMIN (GLUCOPHAGE-XR) 500 MG 24 hr tablet [Pharmacy Med Name: METFORMIN HCL ER TABS 500MG] 360 tablet 0    Sig: TAKE 2 TABLETS TWICE A DAY (NEEDS TO SCHEDULE OFFICE VISIT FOR FOLLOW UP)     Endocrinology:  Diabetes - Biguanides Failed - 09/13/2022 12:32 AM      Failed - HBA1C is between 0 and 7.9 and within 180 days    Hemoglobin A1C  Date Value Ref Range Status  07/31/2022 9.1 (A) 4.0 - 5.6 % Final   Hgb A1c MFr Bld  Date Value Ref Range Status  07/18/2016 7.5 (H) 4.8 - 5.6 % Final    Comment:             Pre-diabetes: 5.7 - 6.4          Diabetes: >6.4          Glycemic control for adults with diabetes: <7.0          Failed - B12 Level in normal range and within 720 days    No results found for: "VITAMINB12"       Failed - CBC within normal limits and completed in the last 12 months    WBC  Date Value Ref Range Status  07/27/2022 7.1 3.4 - 10.8 x10E3/uL Final   RBC  Date Value Ref Range Status  07/27/2022 4.96 4.14 - 5.80 x10E6/uL Final   Hemoglobin  Date Value Ref Range Status  07/27/2022 15.7 13.0 - 17.7 g/dL Final   Hematocrit  Date Value Ref Range Status  07/27/2022 45.9 37.5 - 51.0 % Final   MCHC  Date Value Ref Range Status  07/27/2022 34.2 31.5 - 35.7 g/dL Final   [facility]  Date Value Ref Range Status  07/27/2022 31.7 26.6 - 33.0 pg Final   MCV  Date Value Ref Range Status  07/27/2022 93 79 - 97 fL Final   No results found for: "PLTCOUNTKUC", "LABPLAT", "POCPLA" RDW  Date Value Ref Range Status  07/27/2022 12.4 11.6 - 15.4 % Final         Passed - Cr in normal range and within 360 days    Creatinine, Ser  Date Value Ref Range Status  07/27/2022 1.18 0.76 - 1.27 mg/dL Final   Creatinine, POC  Date Value Ref Range Status  07/10/2017 NA mg/dL Final         Passed - eGFR in normal range and within 360 days    GFR calc Af Amer  Date Value Ref Range Status  11/02/2020 69 >59  mL/min/1.73 Final    Comment:    **In accordance with recommendations from the NKF-ASN Task force,**   [facility] is in the process of updating its eGFR calculation to the   2021 CKD-EPI creatinine equation that estimates kidney function   without a race variable.    GFR calc non Af Amer  Date Value Ref Range Status  11/02/2020 60 >59 mL/min/1.73 Final   eGFR  Date Value Ref Range Status  07/27/2022 70 >59 mL/min/1.73 Final         Passed - Valid encounter within last 6 months    Recent Outpatient Visits           1 month ago Diabetes mellitus with nephropathy [facility])   [facility] Birdie Sons, MD   5 months ago Diabetes mellitus with nephropathy [facility])   [facility] Birdie Sons, MD   12 months ago Diabetes  mellitus with nephropathy [facility])   [facility] Birdie Sons, MD   1 year ago Diabetes mellitus with nephropathy [facility])   [facility] Birdie Sons, MD   1 year ago Diabetes mellitus with nephropathy [facility])   [facility] Birdie Sons, MD       Future Appointments             In 1 month Fisher, Kirstie Peri, MD [facility] [place]   In 2 months [name], MD [place]

## 2022-11-06 ENCOUNTER — Ambulatory Visit: Payer: Managed Care, Other (non HMO) | Admitting: Family Medicine

## 2022-11-09 ENCOUNTER — Other Ambulatory Visit: Payer: Self-pay | Admitting: *Deleted

## 2022-11-09 DIAGNOSIS — C61 Malignant neoplasm of prostate: Secondary | ICD-10-CM

## 2022-11-09 DIAGNOSIS — R972 Elevated prostate specific antigen [PSA]: Secondary | ICD-10-CM

## 2022-11-10 ENCOUNTER — Other Ambulatory Visit: Payer: Managed Care, Other (non HMO)

## 2022-11-14 ENCOUNTER — Ambulatory Visit: Payer: Managed Care, Other (non HMO) | Admitting: Urology

## 2022-11-14 ENCOUNTER — Encounter: Payer: Self-pay | Admitting: Urology

## 2022-11-14 VITALS — BP 123/76 | HR 65 | Ht 71.5 in | Wt 251.0 lb

## 2022-11-14 DIAGNOSIS — N529 Male erectile dysfunction, unspecified: Secondary | ICD-10-CM

## 2022-11-14 DIAGNOSIS — C61 Malignant neoplasm of prostate: Secondary | ICD-10-CM | POA: Diagnosis not present

## 2022-11-14 DIAGNOSIS — R972 Elevated prostate specific antigen [PSA]: Secondary | ICD-10-CM | POA: Diagnosis not present

## 2022-11-14 DIAGNOSIS — R351 Nocturia: Secondary | ICD-10-CM

## 2022-11-14 DIAGNOSIS — N401 Enlarged prostate with lower urinary tract symptoms: Secondary | ICD-10-CM

## 2022-11-14 NOTE — Progress Notes (Signed)
I,Ronald Shah,acting as a scribe for Ronald Espy, MD.,have documented all relevant documentation on the behalf of Ronald Espy, MD,as directed by  Ronald Espy, MD while in the presence of Ronald Espy, MD.  11/14/22 9:41 AM   Ronald Shah March 30, 1961 [id number]  Referring provider: Birdie Sons, MD [address] [place],  [place] [address]  Chief Complaint  Patient presents with   Prostate Cancer    HPI: 62 year-old male with a personal history of low risk prostate cancer, BPH with nocturia, and erectile dysfunction  returns today for a 6 month follow up. He is on active surveillance.  He was diagnosed in November 2020 with low volume, low risk prostate cancer. He had one core of Gleason 3+3 involving 10% of the tissue. Other areas were suspicious but not diagnostic. TRUS volume at the time was 61. He had a prostate MRI in December of 2021 which showed a PIRADS 4 lesion. He had a fusion biopsy in February of 2023 with surgical pathology consistent with Gleason 3+3 at that region of interest.    He's managed on Sildenafil.   His most recent PSA was collected by his primary care on 07/27/22 was 5.9; so overall stable.   He is doing well overall with no new complaints. He is pleased with the results of the Sildenafil.   PSA Trend 11/14/2022    awaiting result 07/27/2022    5.9 05/02/2022    6.0 08/30/2021  5.56 02/17/2021      4.62 08/27/2020  4.86 02/09/2020    3.49 07/01/2019  4.8 05/23/2019      4.5   PMH: Past Medical History:  Diagnosis Date   Diabetes mellitus without complication ([place])    History of chicken pox    Hyperlipidemia    Prostate cancer [facility])     Surgical History: Past Surgical History:  Procedure Laterality Date   COLONOSCOPY     COLONOSCOPY WITH PROPOFOL N/A 08/21/2022   Procedure: COLONOSCOPY WITH PROPOFOL;  Surgeon: Ronald Bellows, MD;  Location: [facility] ENDOSCOPY;  Service: Gastroenterology;  Laterality: N/A;    Home  Medications:  Allergies as of 11/14/2022   No Known Allergies      Medication List        Accurate as of November 14, 2022  9:41 AM. If you have any questions, ask your nurse or doctor.          glipiZIDE 10 MG tablet Commonly known as: GLUCOTROL TAKE 1 TABLET DAILY BEFORE BREAKFAST   metFORMIN 500 MG 24 hr tablet Commonly known as: GLUCOPHAGE-XR TAKE 2 TABLETS TWICE A DAY (NEEDS TO SCHEDULE OFFICE VISIT FOR FOLLOW UP)   pravastatin 40 MG tablet Commonly known as: PRAVACHOL TAKE 1 TABLET BY MOUTH EVERY EVENING.   Rybelsus 14 MG Tabs Generic drug: Semaglutide Take 1 tablet (14 mg total) by mouth daily.   sildenafil 20 MG tablet Commonly known as: REVATIO Take 3-5 tablets 1 hour prior to intercourse as needed         Family History: Family History  Problem Relation Age of Onset   Diabetes Mother     Social History:  reports that he has quit smoking. His smoking use included e-cigarettes. He has never used smokeless tobacco. He reports current alcohol use. He reports that he does not use drugs.   Physical Exam: BP 123/76   Pulse 65   Ht 5' 11.5" (1.816 m)   Wt 251 lb (113.9 kg)   BMI 34.52 kg/m  Constitutional:  Alert and oriented, No acute distress. HEENT: [place] AT, moist mucus membranes.  Trachea midline, no masses. GU: Prostate firm, about 50 grams, no nodules, non tender.  Neurologic: Grossly intact, no focal deficits, moving all 4 extremities. Psychiatric: Normal mood and affect.   Assessment & Plan:    Prostate Cancer  - Rectal exam done today.   - PSA stable.  - Continue active surveillance.   - Check PSA at least every 6 months and rectal exam annually.  - Escalate diagnostic intervention if PSA begins to rise.   2. BPH with nocturia  - Urinary symptoms stable.   3. Erectile dysfunction  - Continue Sildenafil as needed.  Return in about 6 months (around 05/15/2023) for PSA.  I have reviewed the above documentation for accuracy and  completeness, and I agree with the above.   Ronald Espy, MD   [facility] [address] [place], [place] [address] [phone]

## 2022-11-15 LAB — PSA: Prostate Specific Ag, Serum: 6.2 ng/mL — ABNORMAL HIGH (ref 0.0–4.0)

## 2022-11-27 ENCOUNTER — Ambulatory Visit: Payer: Managed Care, Other (non HMO) | Admitting: Family Medicine

## 2022-11-27 ENCOUNTER — Encounter: Payer: Self-pay | Admitting: Family Medicine

## 2022-11-27 VITALS — BP 120/65 | Ht 72.0 in | Wt 255.0 lb

## 2022-11-27 DIAGNOSIS — E1121 Type 2 diabetes mellitus with diabetic nephropathy: Secondary | ICD-10-CM

## 2022-11-27 LAB — POCT GLYCOSYLATED HEMOGLOBIN (HGB A1C)
Est. average glucose Bld gHb Est-mCnc: 309
Hemoglobin A1C: 12.4 % — AB (ref 4.0–5.6)

## 2022-11-27 MED ORDER — TRULICITY 1.5 MG/0.5ML ~~LOC~~ SOAJ: 1.5000 mg | SUBCUTANEOUS | 0 refills | Status: DC

## 2022-11-27 NOTE — Progress Notes (Signed)
      Established patient visit   Patient: Ronald Shah   DOB: 06-01-1961   62 y.o. Male  MRN: [id number] Visit Date: 11/27/2022  Today's healthcare provider: Lelon Huh, MD   Chief Complaint  Patient presents with   Diabetes   Hypertension   Hyperlipidemia   Subjective    Diabetes Pertinent negatives for diabetes include no chest pain.  Hypertension Pertinent negatives include no chest pain, palpitations or shortness of breath.  Hyperlipidemia Pertinent negatives include no chest pain or shortness of breath.   Here today to follow up on diabetes. Is taking oral semaglutide, metformin, and glipizide consistently every day. Has a little nausea if he takes semaglutide on empty stomach. Home sugars have been higher.   Medications: Outpatient Medications Prior to Visit  Medication Sig Note   glipiZIDE (GLUCOTROL) 10 MG tablet TAKE 1 TABLET DAILY BEFORE BREAKFAST    metFORMIN (GLUCOPHAGE-XR) 500 MG 24 hr tablet TAKE 2 TABLETS TWICE A DAY (NEEDS TO SCHEDULE OFFICE VISIT FOR FOLLOW UP)    pravastatin (PRAVACHOL) 40 MG tablet TAKE 1 TABLET BY MOUTH EVERY EVENING.    sildenafil (REVATIO) 20 MG tablet Take 3-5 tablets 1 hour prior to intercourse as needed    Semaglutide (RYBELSUS) 14 MG TABS Take 1 tablet (14 mg total) by mouth daily.    No facility-administered medications prior to visit.    Review of Systems  Constitutional:  Negative for appetite change, chills and fever.  Respiratory:  Negative for chest tightness, shortness of breath and wheezing.   Cardiovascular:  Negative for chest pain and palpitations.  Gastrointestinal:  Negative for abdominal pain, nausea and vomiting.       Objective    BP 120/65 (BP Location: Left Arm, Patient Position: Sitting, Cuff Size: Normal)   Ht 6' (1.829 m)   Wt 255 lb (115.7 kg)   SpO2 98%   BMI 34.58 kg/m      Results for orders placed or performed in visit on 11/27/22  POCT HgB A1C  Result Value Ref Range   Hemoglobin  A1C 12.4 (A) 4.0 - 5.6 %   Est. average glucose Bld gHb Est-mCnc 309     Assessment & Plan    1. Diabetes mellitus with nephropathy (HCC) Uncontrolled. Change oral semaglutide to Dulaglutide (TRULICITY) 1.5 OV/7.8HY SOPN; Inject 1.5 mg into the skin once a week.  Dispense: 2 mL; Refill: 0 will titrate to 4.5 mg over the next 2 months unless not tolerating.   2. Morbid obesity ([place])   Return in about 3 months (around 02/27/2023), or diabetes.         Lelon Huh, MD  [employer] [phone] (phone) [phone] (fax)  [place]

## 2022-11-27 NOTE — Patient Instructions (Signed)
.   Please review the attached list of medications and notify my office if there are any errors.   . Please bring all of your medications to every appointment so we can make sure that our medication list is the same as yours.   

## 2022-12-12 ENCOUNTER — Other Ambulatory Visit: Payer: Self-pay | Admitting: Family Medicine

## 2022-12-22 ENCOUNTER — Other Ambulatory Visit: Payer: Self-pay | Admitting: Family Medicine

## 2022-12-22 DIAGNOSIS — E1121 Type 2 diabetes mellitus with diabetic nephropathy: Secondary | ICD-10-CM

## 2022-12-22 MED ORDER — TRULICITY 3 MG/0.5ML ~~LOC~~ SOAJ: 3.0000 mg | SUBCUTANEOUS | 3 refills | Status: DC

## 2023-01-16 ENCOUNTER — Other Ambulatory Visit: Payer: Self-pay | Admitting: Family Medicine

## 2023-01-29 ENCOUNTER — Telehealth: Payer: Self-pay

## 2023-01-29 NOTE — Telephone Encounter (Signed)
Copied from CRM [id number]. Topic: General - Other >> Jan 29, 2023 10:48 AM Everette C wrote: Reason for CRM: Erskine Squibb has called to request prior authorization for Dulaglutide (TRULICITY) 3 MG/0.5ML Namon Cirri [[id number]]   Erskine Squibb shares that PA can be expedited if marked urgent and may take up to 72 hours to process   Verbal authorization can also done by phone at [phone]  Please contact [employer] [facility] for Rx Order at [phone] if needed

## 2023-02-22 ENCOUNTER — Other Ambulatory Visit: Payer: Self-pay | Admitting: Family Medicine

## 2023-02-22 DIAGNOSIS — E1121 Type 2 diabetes mellitus with diabetic nephropathy: Secondary | ICD-10-CM

## 2023-02-22 MED ORDER — TRULICITY 3 MG/0.5ML ~~LOC~~ SOAJ: 3.0000 mg | SUBCUTANEOUS | 1 refills | Status: DC

## 2023-02-22 NOTE — Telephone Encounter (Signed)
Requested Prescriptions  Pending Prescriptions Disp Refills   Dulaglutide (TRULICITY) 3 MG/0.5ML SOPN 6 mL 1    Sig: Inject 3 mg as directed once a week.     Endocrinology:  Diabetes - GLP-1 Receptor Agonists Failed - 02/22/2023  5:01 PM      Failed - HBA1C is between 0 and 7.9 and within 180 days    Hemoglobin A1C  Date Value Ref Range Status  11/27/2022 12.4 (A) 4.0 - 5.6 % Final   Hgb A1c MFr Bld  Date Value Ref Range Status  07/18/2016 7.5 (H) 4.8 - 5.6 % Final    Comment:             Pre-diabetes: 5.7 - 6.4          Diabetes: >6.4          Glycemic control for adults with diabetes: <7.0          Passed - Valid encounter within last 6 months    Recent Outpatient Visits           2 months ago Diabetes mellitus with nephropathy (HCC)   [place] [facility] Malva Limes, MD   6 months ago Diabetes mellitus with nephropathy [facility])   [place] [facility] Malva Limes, MD   11 months ago Diabetes mellitus with nephropathy [facility])   [place] [facility] Malva Limes, MD   1 year ago Diabetes mellitus with nephropathy [facility])   [place] [facility] Malva Limes, MD   1 year ago Diabetes mellitus with nephropathy [facility])    [facility] Malva Limes, MD       Future Appointments             In 2 weeks Fisher, Demetrios Isaacs, MD [facility]   In 8 months Vanna Scotland, MD [facility] [place]

## 2023-02-22 NOTE — Telephone Encounter (Signed)
Medication Refill - Medication: Dulaglutide (TRULICITY) 3 MG/0.5ML SOPN   Has the patient contacted their pharmacy? Yes.     Preferred Pharmacy (with phone number or street name):  [facility] - [place], [place] - [address] Phone: [phone]  Fax: [phone]      Has the patient been seen for an appointment in the last year OR does the patient have an upcoming appointment? Yes.    Please email or fax as soon as possible. The email address is [email or web address]

## 2023-03-09 ENCOUNTER — Encounter: Payer: Self-pay | Admitting: Family Medicine

## 2023-03-09 ENCOUNTER — Ambulatory Visit: Payer: Managed Care, Other (non HMO) | Admitting: Family Medicine

## 2023-03-09 VITALS — BP 107/75 | HR 71 | Temp 98.0°F | Resp 16 | Wt 256.6 lb

## 2023-03-09 DIAGNOSIS — E1121 Type 2 diabetes mellitus with diabetic nephropathy: Secondary | ICD-10-CM

## 2023-03-09 LAB — POCT GLYCOSYLATED HEMOGLOBIN (HGB A1C)
Est. average glucose Bld gHb Est-mCnc: 174
Hemoglobin A1C: 7.7 % — AB (ref 4.0–5.6)

## 2023-03-09 NOTE — Progress Notes (Signed)
I,Sulibeya S Dimas,acting as a scribe for Mila Merry, MD.,have documented all relevant documentation on the behalf of Mila Merry, MD,as directed by  Mila Merry, MD    Established patient visit   Patient: Ronald Shah   DOB: [birth year]   62 y.o. Male  MRN: [id number] Visit Date: 03/09/2023  Today's healthcare provider: Mila Merry, MD   Chief Complaint  Patient presents with   Diabetes   Subjective    Diabetes Pertinent negatives for hypoglycemia include no dizziness or headaches. Pertinent negatives for diabetes include no chest pain and no fatigue.    Diabetes Mellitus Type II, Follow-up  Lab Results  Component Value Date   HGBA1C 12.4 (A) 11/27/2022   HGBA1C 9.1 (A) 07/31/2022   HGBA1C 12.3 (A) 03/28/2022   Wt Readings from Last 3 Encounters:  11/27/22 255 lb (115.7 kg)  11/14/22 251 lb (113.9 kg)  08/21/22 253 lb 6.4 oz (114.9 kg)   Last seen for diabetes 3 months ago.  Management since then includes change oral semaglutide to Trulicity. He reports excellent compliance with treatment. He is not having side effects.  Symptoms: No fatigue No foot ulcerations  No appetite changes No nausea  No paresthesia of the feet  No polydipsia  No polyuria No visual disturbances   No vomiting     Home blood sugar records: fasting range: 130-140's  Episodes of hypoglycemia? No    Current insulin regiment: none Most Recent Eye Exam:   Pertinent Labs: Lab Results  Component Value Date   CHOL 135 07/27/2022   HDL 49 07/27/2022   LDLCALC 72 07/27/2022   LDLDIRECT 72 03/26/2018   TRIG 70 07/27/2022   CHOLHDL 2.8 07/27/2022   Lab Results  Component Value Date   NA 138 07/27/2022   K 4.9 07/27/2022   CREATININE 1.18 07/27/2022   EGFR 70 07/27/2022   LABMICR 49.9 03/28/2022   MICRALBCREAT 37 (H) 03/28/2022     ---------------------------------------------------------------------------------------------------   Medications: Outpatient  Medications Prior to Visit  Medication Sig   Dulaglutide (TRULICITY) 3 MG/0.5ML SOPN Inject 3 mg as directed once a week.   glipiZIDE (GLUCOTROL) 10 MG tablet TAKE 1 TABLET DAILY BEFORE BREAKFAST   metFORMIN (GLUCOPHAGE-XR) 500 MG 24 hr tablet TAKE 2 TABLETS TWICE A DAY (NEEDS TO SCHEDULE OFFICE VISIT FOR FOLLOW UP)   pravastatin (PRAVACHOL) 40 MG tablet TAKE 1 TABLET BY MOUTH EVERY EVENING.   sildenafil (REVATIO) 20 MG tablet Take 3-5 tablets 1 hour prior to intercourse as needed   No facility-administered medications prior to visit.    Review of Systems  Constitutional:  Negative for appetite change and fatigue.  Eyes:  Negative for visual disturbance.  Respiratory:  Negative for chest tightness, shortness of breath and wheezing.   Cardiovascular:  Negative for chest pain and leg swelling.  Gastrointestinal:  Negative for abdominal pain, nausea and vomiting.  Neurological:  Negative for dizziness, light-headedness and headaches.       Objective    BP 107/75 (BP Location: Left Arm, Patient Position: Sitting, Cuff Size: Large)   Pulse 71   Temp 98 F (36.7 C) (Temporal)   Resp 16   Wt 256 lb 9.6 oz (116.4 kg)   SpO2 99%   BMI 34.80 kg/m     Results for orders placed or performed in visit on 03/09/23  POCT glycosylated hemoglobin (Hb A1C)  Result Value Ref Range   Hemoglobin A1C 7.7 (A) 4.0 - 5.6 %   Est. average glucose Bld  gHb Est-mCnc 174     Assessment & Plan     1. Diabetes mellitus with nephropathy (HCC) Doing much better with change from Rybelsus to Trulicity with no adverse effects. Continue current medications.   Follow up CPE in November.      The entirety of the information documented in the History of Present Illness, Review of Systems and Physical Exam were personally obtained by me. Portions of this information were initially documented by the CMA and reviewed by me for thoroughness and accuracy.     Mila Merry, MD  [facility] [phone] (phone) [phone] (fax)  [facility]

## 2023-03-09 NOTE — Patient Instructions (Signed)
.   Please review the attached list of medications and notify my office if there are any errors.   . Please bring all of your medications to every appointment so we can make sure that our medication list is the same as yours.   

## 2023-05-10 ENCOUNTER — Other Ambulatory Visit: Payer: Managed Care, Other (non HMO)

## 2023-05-10 DIAGNOSIS — R972 Elevated prostate specific antigen [PSA]: Secondary | ICD-10-CM

## 2023-05-10 DIAGNOSIS — C61 Malignant neoplasm of prostate: Secondary | ICD-10-CM

## 2023-05-11 ENCOUNTER — Encounter: Payer: Self-pay | Admitting: Urology

## 2023-05-11 LAB — PSA: Prostate Specific Ag, Serum: 8.5 ng/mL — ABNORMAL HIGH (ref 0.0–4.0)

## 2023-06-07 ENCOUNTER — Other Ambulatory Visit: Payer: Self-pay | Admitting: Family Medicine

## 2023-06-07 MED ORDER — PRAVASTATIN SODIUM 40 MG PO TABS: 40.0000 mg | ORAL_TABLET | Freq: Every evening | ORAL | 4 refills | Status: DC

## 2023-06-28 ENCOUNTER — Other Ambulatory Visit: Payer: Self-pay | Admitting: Family Medicine

## 2023-07-27 ENCOUNTER — Other Ambulatory Visit: Payer: Self-pay | Admitting: Family Medicine

## 2023-07-27 DIAGNOSIS — E1121 Type 2 diabetes mellitus with diabetic nephropathy: Secondary | ICD-10-CM

## 2023-08-10 ENCOUNTER — Encounter: Payer: Self-pay | Admitting: Family Medicine

## 2023-08-10 ENCOUNTER — Ambulatory Visit: Payer: Managed Care, Other (non HMO) | Admitting: Family Medicine

## 2023-08-10 VITALS — BP 134/76 | HR 72 | Ht 72.0 in | Wt 262.4 lb

## 2023-08-10 DIAGNOSIS — E1121 Type 2 diabetes mellitus with diabetic nephropathy: Secondary | ICD-10-CM

## 2023-08-10 DIAGNOSIS — E785 Hyperlipidemia, unspecified: Secondary | ICD-10-CM | POA: Diagnosis not present

## 2023-08-10 DIAGNOSIS — R972 Elevated prostate specific antigen [PSA]: Secondary | ICD-10-CM | POA: Diagnosis not present

## 2023-08-10 DIAGNOSIS — Z7984 Long term (current) use of oral hypoglycemic drugs: Secondary | ICD-10-CM

## 2023-08-10 LAB — POCT GLYCOSYLATED HEMOGLOBIN (HGB A1C): Hemoglobin A1C: 8.2 % — AB (ref 4.0–5.6)

## 2023-08-10 NOTE — Progress Notes (Signed)
Established patient visit   Patient: Ronald Shah   DOB: September 22, 1960   62 y.o. Male  MRN: [id number] Visit Date: 08/10/2023  Today's healthcare provider: Mila Merry, MD   Chief Complaint  Patient presents with   Diabetes   Hyperlipidemia   Subjective    HPI HPI     Diabetes   Compliance with treatment is excellent.  Blurred vision: Absent.  Chest pain: Absent.  Fatigue: Absent.  Foot Ulcerations: Absent.  Nausea: Absent.  Paresthesia of the feet: Absent.  Polydipsia: Absent.  Polyuria: Absent.  Visual changes: Absent.  Vomiting: Absent.  Weight loss: Absent.  Episodes of hypoglycemia: Absent.  Home glucose fasting ranges  from 140-180 mg/dl.  Eye exam is not current.  Patient does not see a podiatrist.  The patient never exercises.  Patient's diet is generally unhealty.        Hyperlipidemia   This is a chronic problem.  Compliance with treatment is excellent.  Chest pain: Absent.  Chest pressure/discomfort: Absent.  Dyspnea: Absent.  Focal weakness: Absent.  Lower extremity edema: Absent.  Numbness or tingling of extremity: Absent.  Orthopnea: Absent.  Palpitations: Absent.  Paroxysmal nocturnal dyspnea: Absent.  Speech difficulty: Absent.  Syncope: Absent.  Patient's diet is generally unhealty.        Comments   Diabetic Eye Exam - Patient aware to update CPE - patient has declined. Would like to just do a office visit.       Last edited by Acey Lav, CMA on 08/10/2023  2:55 PM.    Presents for follow up lipids, and diabetes. Doing well on current medications. Having no side effects on Trulicity. Sugars initially improved quite a bit, but have been trending back up.  Lab Results  Component Value Date   HGBA1C 8.2 (A) 08/10/2023   HGBA1C 7.7 (A) 03/09/2023   HGBA1C 12.4 (A) 11/27/2022   Lab Results  Component Value Date   CHOL 135 07/27/2022   HDL 49 07/27/2022   LDLCALC 72 07/27/2022   LDLDIRECT 72 03/26/2018   TRIG 70 07/27/2022   CHOLHDL 2.8  07/27/2022   Lab Results  Component Value Date   NA 138 07/27/2022   K 4.9 07/27/2022   CREATININE 1.18 07/27/2022   EGFR 70 07/27/2022   GLUCOSE 187 (H) 07/27/2022    He is also follow up by Dr. Apolinar Junes for high PSA and is due to have this rechecked next week.   Medications: Outpatient Medications Prior to Visit  Medication Sig   glipiZIDE (GLUCOTROL) 10 MG tablet TAKE 1 TABLET DAILY BEFORE BREAKFAST   metFORMIN (GLUCOPHAGE-XR) 500 MG 24 hr tablet TAKE 2 TABLETS TWICE A DAY (NEEDS TO SCHEDULE OFFICE VISIT FOR FOLLOW UP)   pravastatin (PRAVACHOL) 40 MG tablet Take 1 tablet (40 mg total) by mouth every evening.   sildenafil (REVATIO) 20 MG tablet Take 3-5 tablets 1 hour prior to intercourse as needed   TRULICITY 3 MG/0.5ML SOAJ INJECT 3 MG AS DIRECTED ONCE A WEEK   No facility-administered medications prior to visit.    Review of Systems     Objective    BP 134/76 (BP Location: Left Arm, Patient Position: Sitting, Cuff Size: Large)   Pulse 72   Ht 6' (1.829 m)   Wt 262 lb 6.4 oz (119 kg)   SpO2 100%   BMI 35.59 kg/m    Physical Exam   General: Appearance:    Obese male in no acute distress  Eyes:  PERRL, conjunctiva/corneas clear, EOM's intact       Lungs:     Clear to auscultation bilaterally, respirations unlabored  Heart:    Normal heart rate. Normal rhythm. No murmurs, rubs, or gallops.    MS:   All extremities are intact.    Neurologic:   Awake, alert, oriented x 3. No apparent focal neurological defect.         Results for orders placed or performed in visit on 08/10/23  POCT HgB A1C  Result Value Ref Range   Hemoglobin A1C 8.2 (A) 4.0 - 5.6 %    Assessment & Plan     1. Diabetes mellitus with nephropathy (HCC) Doing well on current meds with no side effects, but A1c is trending back up. He just had 12 weeks of 3mg  Trulicity dispensed earlier this month. . Will change to 4.5 mg with next refill in February.  - Comprehensive metabolic panel - Urine  microalbumin-creatinine with uACR  2. Hyperlipidemia, unspecified hyperlipidemia type He is tolerating pravastatin well with no adverse effects.   - CBC with Differential/Platelet - Lipid Panel With LDL/HDL Ratio  3. Morbid obesity (HCC) Anticipate some weight loss with increased dose of Trulicity.  - Comprehensive metabolic panel  4. Elevated PSA Followed by Dr. Apolinar Junes. Due for PSA next week and will forward to her for review.   Return in about 5 months (around 01/08/2024) for Diabetes.         Mila Merry, MD  [facility] [phone] (phone) [phone] (fax)  [facility]

## 2023-08-10 NOTE — Patient Instructions (Signed)
.   Please review the attached list of medications and notify my office if there are any errors.   . Please bring all of your medications to every appointment so we can make sure that our medication list is the same as yours.   

## 2023-08-11 LAB — CBC WITH DIFFERENTIAL/PLATELET
Basophils Absolute: 0 10*3/uL (ref 0.0–0.2)
Basos: 0 %
EOS (ABSOLUTE): 0.1 10*3/uL (ref 0.0–0.4)
Eos: 1 %
Hematocrit: 46.9 % (ref 37.5–51.0)
Hemoglobin: 15.5 g/dL (ref 13.0–17.7)
Immature Grans (Abs): 0 10*3/uL (ref 0.0–0.1)
Immature Granulocytes: 0 %
Lymphocytes Absolute: 2.3 10*3/uL (ref 0.7–3.1)
Lymphs: 33 %
MCH: 31.1 pg (ref 26.6–33.0)
MCHC: 33 g/dL (ref 31.5–35.7)
MCV: 94 fL (ref 79–97)
Monocytes Absolute: 0.7 10*3/uL (ref 0.1–0.9)
Monocytes: 10 %
Neutrophils Absolute: 3.8 10*3/uL (ref 1.4–7.0)
Neutrophils: 56 %
Platelets: 226 10*3/uL (ref 150–450)
RBC: 4.98 x10E6/uL (ref 4.14–5.80)
RDW: 12.1 % (ref 11.6–15.4)
WBC: 6.9 10*3/uL (ref 3.4–10.8)

## 2023-08-11 LAB — LIPID PANEL WITH LDL/HDL RATIO
Cholesterol, Total: 127 mg/dL (ref 100–199)
HDL: 51 mg/dL (ref >39)
LDL Chol Calc (NIH): 58 mg/dL (ref 0–99)
LDL/HDL Ratio: 1.1 ratio (ref 0.0–3.6)
Triglycerides: 95 mg/dL (ref 0–149)
VLDL Cholesterol Cal: 18 mg/dL (ref 5–40)

## 2023-08-11 LAB — COMPREHENSIVE METABOLIC PANEL
ALT: 41 [IU]/L (ref 0–44)
AST: 18 [IU]/L (ref 0–40)
Albumin: 4.3 g/dL (ref 3.9–4.9)
Alkaline Phosphatase: 70 [IU]/L (ref 44–121)
BUN/Creatinine Ratio: 16 (ref 10–24)
BUN: 19 mg/dL (ref 8–27)
Bilirubin Total: 0.4 mg/dL (ref 0.0–1.2)
CO2: 25 mmol/L (ref 20–29)
Calcium: 9.9 mg/dL (ref 8.6–10.2)
Chloride: 101 mmol/L (ref 96–106)
Creatinine, Ser: 1.17 mg/dL (ref 0.76–1.27)
Globulin, Total: 2.7 g/dL (ref 1.5–4.5)
Glucose: 323 mg/dL — ABNORMAL HIGH (ref 70–99)
Potassium: 4.9 mmol/L (ref 3.5–5.2)
Sodium: 139 mmol/L (ref 134–144)
Total Protein: 7 g/dL (ref 6.0–8.5)
eGFR: 70 mL/min/{1.73_m2} (ref >59)

## 2023-08-11 LAB — PSA: Prostate Specific Ag, Serum: 6.3 ng/mL — ABNORMAL HIGH (ref 0.0–4.0)

## 2023-08-12 LAB — MICROALBUMIN / CREATININE URINE RATIO
Creatinine, Urine: 65.3 mg/dL
Microalb/Creat Ratio: 31 mg/g{creat} — ABNORMAL HIGH (ref 0–29)
Microalbumin, Urine: 20.2 ug/mL

## 2023-08-14 ENCOUNTER — Other Ambulatory Visit: Payer: Managed Care, Other (non HMO)

## 2023-09-10 ENCOUNTER — Other Ambulatory Visit: Payer: Self-pay | Admitting: Family Medicine

## 2023-10-04 ENCOUNTER — Other Ambulatory Visit: Payer: Self-pay | Admitting: Family Medicine

## 2023-10-04 DIAGNOSIS — E1121 Type 2 diabetes mellitus with diabetic nephropathy: Secondary | ICD-10-CM

## 2023-10-04 MED ORDER — TRULICITY 4.5 MG/0.5ML ~~LOC~~ SOAJ: 4.5000 mg | SUBCUTANEOUS | 3 refills | Status: DC

## 2023-11-09 ENCOUNTER — Other Ambulatory Visit: Payer: Managed Care, Other (non HMO)

## 2023-11-14 ENCOUNTER — Ambulatory Visit: Payer: Managed Care, Other (non HMO) | Admitting: Urology

## 2023-11-14 VITALS — BP 152/90 | HR 67 | Ht 72.0 in | Wt 257.0 lb

## 2023-11-14 DIAGNOSIS — R351 Nocturia: Secondary | ICD-10-CM

## 2023-11-14 DIAGNOSIS — N529 Male erectile dysfunction, unspecified: Secondary | ICD-10-CM | POA: Diagnosis not present

## 2023-11-14 DIAGNOSIS — N401 Enlarged prostate with lower urinary tract symptoms: Secondary | ICD-10-CM

## 2023-11-14 DIAGNOSIS — C61 Malignant neoplasm of prostate: Secondary | ICD-10-CM

## 2023-11-14 LAB — BLADDER SCAN AMB NON-IMAGING: Scan Result: 67

## 2023-11-14 NOTE — Progress Notes (Signed)
 Marcelle Overlie Plume,Ronald Shah as a scribe for Vanna Scotland, MD.,have documented all relevant documentation on the behalf of Vanna Scotland, MD,as directed by  Vanna Scotland, MD while in the presence of Vanna Scotland, MD.  11/14/23 10:11 AM   Ronald Shah [birth year] [id number]  Referring provider: Malva Limes, MD [address] [place],  [place] [address]  Chief Complaint  Patient presents with   Benign Prostatic Hypertrophy    HPI: 63 year old male presents for a routine annual follow-up. 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 He is on active surveillance for prostate cancer.   He was diagnosed in November 2020 with low volume, low risk prostate cancer. He had one core of Gleason 3+3 involving 10% of the tissue. Other areas were suspicious but not diagnostic. TRUS volume at the time was 61. He had a prostate MRI in December of 2021 which showed a PI-RADS 4 lesion. He had a fusion biopsy in February of 2023 with surgical pathology consistent with Gleason 3+3 at that region of interest.    His most recent PSA on 08/10/2023 was 6.3. PSA levels are stable compared to last year, with a previous spike six months ago but minimal change overall.  He experiences slow urination in the morning, which improves in the afternoon, but it does not bother him enough to seek treatment or medication.   He continues to use Sildenafil as needed for erectile dysfunction. He is satisfied with his current management plan and does not require any changes at this time.   Prostate Specific Ag, Serum  Latest Ref Rng 0.0 - 4.0 ng/mL  04/21/2019 4.6 (H)   05/23/2019 4.5 (H)   07/01/2019 4.8 (H)   05/02/2022 6.0 (H)   07/27/2022 5.9 (H)   11/14/2022 6.2 (H)   05/10/2023 8.5 (H)   08/10/2023 6.3 (H)      Results for orders placed or performed in visit on 11/14/23  Bladder Scan (Post Void Residual) in office  Result Value Ref Range   Scan  Result 67 ml     IPSS     Row Name 11/14/23 0800         International Prostate Symptom Score   How often have you had the sensation of not emptying your bladder? Not at All     How often have you had to urinate less than every two hours? Less than half the time     How often have you found you stopped and started again several times when you urinated? Less than 1 in 5 times     How often have you found it difficult to postpone urination? Less than half the time     How often have you had a weak urinary stream? About half the time     How often have you had to strain to start urination? Less than 1 in 5 times     How many times did you typically get up at night to urinate? 1 Time     Total IPSS Score 10       Quality of Life due to urinary symptoms   If you were to spend the rest of your life with your urinary condition just the way it is now how would you feel about that? Mostly Satisfied              Score:  1-7 Mild 8-19 Moderate 20-35 Severe   PMH: Past Medical History:  Diagnosis Date   Diabetes mellitus without complication (HCC)    History of chicken pox    Hyperlipidemia    Prostate cancer [facility])     Surgical History: Past Surgical History:  Procedure Laterality Date   COLONOSCOPY     COLONOSCOPY WITH PROPOFOL N/A 08/21/2022   Procedure: COLONOSCOPY WITH PROPOFOL;  Surgeon: Wyline Mood, MD;  [facility];  Service: Gastroenterology;  Laterality: N/A;    Home Medications:  Allergies as of 11/14/2023   No Known Allergies      Medication List        Accurate as of November 14, 2023 10:11 AM. If you have any questions, ask your nurse or doctor.          glipiZIDE 10 MG tablet Commonly known as: GLUCOTROL TAKE 1 TABLET DAILY BEFORE BREAKFAST   metFORMIN 500 MG 24 hr tablet Commonly known as: GLUCOPHAGE-XR TAKE 2 TABLETS TWICE A DAY (NEEDS TO SCHEDULE OFFICE VISIT FOR FOLLOW UP)   pravastatin 40 MG tablet Commonly known as:  PRAVACHOL Take 1 tablet (40 mg total) by mouth every evening.   sildenafil 20 MG tablet Commonly known as: REVATIO Take 3-5 tablets 1 hour prior to intercourse as needed   Trulicity 4.5 MG/0.5ML Soaj Generic drug: Dulaglutide Inject 4.5 mg as directed once a week.         Family History: Family History  Problem Relation Age of Onset   Diabetes Mother     Social History:  reports that he has quit smoking. His smoking use included e-cigarettes. He has never used smokeless tobacco. He reports current alcohol use. He reports that he does not use drugs.   Physical Exam: BP (!) 152/90   Pulse 67   Ht 6' (1.829 m)   Wt 257 lb (116.6 kg)   BMI 34.86 kg/m   Constitutional:  Alert and oriented, No acute distress. HEENT: [place] AT, moist mucus membranes.  Trachea midline, no masses. Neurologic: Grossly intact, no focal deficits, moving all 4 extremities. GU: Prostate approximately 50 grams, no nodules, no tenderness. Psychiatric: Normal mood and affect.   Assessment & Plan:    1. Prostate Cancer  - Rectal exam done today.   - PSA stable.  - Continue active surveillance.   - Check PSA at least every 6 months and rectal exam annually.  - Escalate diagnostic intervention if PSA begins to rise.    2. BPH with nocturia  - Urinary symptoms stable.    3. Erectile dysfunction  - Continue Sildenafil as needed.  Return in about 6 months (around 05/13/2024) for repeat PSA.only, PSA/ DRE/ IPSS/ PVR in 1 year with MD  I have reviewed the above documentation for accuracy and completeness, and I agree with the above.   Vanna Scotland, MD   [facility] [address] [place], [place] [address] [phone]

## 2024-01-08 ENCOUNTER — Encounter: Payer: Self-pay | Admitting: Family Medicine

## 2024-01-08 ENCOUNTER — Ambulatory Visit: Payer: Self-pay | Admitting: Family Medicine

## 2024-01-08 VITALS — BP 113/86 | HR 65 | Ht 72.0 in | Wt 253.7 lb

## 2024-01-08 DIAGNOSIS — Z7984 Long term (current) use of oral hypoglycemic drugs: Secondary | ICD-10-CM

## 2024-01-08 DIAGNOSIS — E1121 Type 2 diabetes mellitus with diabetic nephropathy: Secondary | ICD-10-CM

## 2024-01-08 DIAGNOSIS — R972 Elevated prostate specific antigen [PSA]: Secondary | ICD-10-CM

## 2024-01-08 LAB — POCT GLYCOSYLATED HEMOGLOBIN (HGB A1C): Hemoglobin A1C: 8.4 % — AB (ref 4.0–5.6)

## 2024-01-08 NOTE — Progress Notes (Signed)
 Established patient visit   Patient: Ronald Shah   DOB: 1961-05-21   63 y.o. Male  MRN: [id number] Visit Date: 01/08/2024  Today's healthcare provider: Jeralene Mom, MD   Chief Complaint  Patient presents with   Diabetes    Takes meds as prescribed, no change in diet, walks around daily at work, no concerns Pt checks sugars sometimes and reports normally being 170-150     Follow-up   Subjective    Diabetes Pertinent negatives for diabetes include no chest pain.   Follow up diabetes, feels well, not too strict with diet, but losing weight. Just started higher dose of 4.5mg  Trulicity  about 2 weeks ago with no side effects.   Wt Readings from Last 3 Encounters:  01/08/24 253 lb 11.2 oz (115.1 kg)  11/14/23 257 lb (116.6 kg)  08/10/23 262 lb 6.4 oz (119 kg)   Lab Results  Component Value Date   HGBA1C 8.4 (A) 01/08/2024   HGBA1C 8.2 (A) 08/10/2023   HGBA1C 7.7 (A) 03/09/2023   Lab Results  Component Value Date   MICROALBUR negative 02/11/2021   LDLCALC 58 08/10/2023   CREATININE 1.17 08/10/2023   Lab Results  Component Value Date   CHOL 127 08/10/2023   HDL 51 08/10/2023   LDLCALC 58 08/10/2023   LDLDIRECT 72 03/26/2018   TRIG 95 08/10/2023   CHOLHDL 2.8 07/27/2022   Lab Results  Component Value Date   NA 139 08/10/2023   K 4.9 08/10/2023   CREATININE 1.17 08/10/2023   EGFR 70 08/10/2023   GLUCOSE 323 (H) 08/10/2023     Medications: Outpatient Medications Prior to Visit  Medication Sig   Dulaglutide  (TRULICITY ) 4.5 MG/0.5ML SOAJ Inject 4.5 mg as directed once a week.   glipiZIDE  (GLUCOTROL ) 10 MG tablet TAKE 1 TABLET DAILY BEFORE BREAKFAST   metFORMIN  (GLUCOPHAGE -XR) 500 MG 24 hr tablet TAKE 2 TABLETS TWICE A DAY (NEEDS TO SCHEDULE OFFICE VISIT FOR FOLLOW UP)   pravastatin  (PRAVACHOL ) 40 MG tablet Take 1 tablet (40 mg total) by mouth every evening.   sildenafil  (REVATIO ) 20 MG tablet Take 3-5 tablets 1 hour prior to intercourse as needed    No facility-administered medications prior to visit.    Review of Systems  Constitutional:  Negative for appetite change, chills and fever.  Respiratory:  Negative for chest tightness, shortness of breath and wheezing.   Cardiovascular:  Negative for chest pain and palpitations.  Gastrointestinal:  Negative for abdominal pain, nausea and vomiting.       Objective    BP 113/86 (BP Location: Left Arm, Cuff Size: Normal)   Pulse 65   Ht 6' (1.829 m)   Wt 253 lb 11.2 oz (115.1 kg)   SpO2 98%   BMI 34.41 kg/m    Physical Exam  General appearance: Mildly obese male, cooperative and in no acute distress Head: Normocephalic, without obvious abnormality, atraumatic Respiratory: Respirations even and unlabored, normal respiratory rate Extremities: All extremities are intact.  Skin: Skin color, texture, turgor normal. No rashes seen  Psych: Appropriate mood and affect. Neurologic: Mental status: Alert, oriented to person, place, and time, thought content appropriate.   Results for orders placed or performed in visit on 01/08/24  POCT glycosylated hemoglobin (Hb A1C)  Result Value Ref Range   Hemoglobin A1C 8.4 (A) 4.0 - 5.6 %    Assessment & Plan     1. Diabetes mellitus with nephropathy (HCC) (Primary) Tolerating increased dose of Trulicity , but only had two  doses. Is to continue 4.5mg  and all other medications. Working on improving diet. Staying physically active. Recheck A1c in 3 months.   2. Elevated PSA Scheduled to follow up with Dr. Ace Holder in August, but she is leaving practice. May need new referral to new urologist.    Return in about 4 months (around 05/09/2024) for Diabetes.       Jeralene Mom, MD  [facility] [phone] (phone) [phone] (fax)  [facility]

## 2024-01-08 NOTE — Patient Instructions (Signed)
 Marland Kitchen  Please review the attached list of medications and notify my office if there are any errors.   . Please bring all of your medications to every appointment so we can make sure that our medication list is the same as yours.

## 2024-05-09 ENCOUNTER — Encounter: Payer: Self-pay | Admitting: Family Medicine

## 2024-05-09 ENCOUNTER — Ambulatory Visit: Admitting: Family Medicine

## 2024-05-09 VITALS — BP 123/80 | HR 66 | Ht 72.0 in | Wt 256.0 lb

## 2024-05-09 DIAGNOSIS — Z7985 Long-term (current) use of injectable non-insulin antidiabetic drugs: Secondary | ICD-10-CM | POA: Diagnosis not present

## 2024-05-09 DIAGNOSIS — E1121 Type 2 diabetes mellitus with diabetic nephropathy: Secondary | ICD-10-CM

## 2024-05-09 LAB — POCT GLYCOSYLATED HEMOGLOBIN (HGB A1C): Hemoglobin A1C: 8.4 % — AB (ref 4.0–5.6)

## 2024-05-09 NOTE — Patient Instructions (Signed)
 SABRA  Please review the attached list of medications and notify my office if there are any errors.   . Please bring all of your medications to every appointment so we can make sure that our medication list is the same as yours.

## 2024-05-09 NOTE — Progress Notes (Signed)
      Established patient visit   Patient: Ronald Shah   DOB: 1960-12-12   63 y.o. Male  MRN: [id number] Visit Date: 05/09/2024  Today's healthcare provider: Nancyann Perry, MD   Chief Complaint  Patient presents with   Diabetes   Subjective    Diabetes    Follow up t2 dm. Now has been on 4.5 Trulicity  since January with no adverse effects. Occasionally checks home sugars. Was about 170 today which is typical.   Lab Results  Component Value Date   NA 139 08/10/2023   K 4.9 08/10/2023   CREATININE 1.17 08/10/2023   EGFR 70 08/10/2023   GLUCOSE 323 (H) 08/10/2023  \  Lab Results  Component Value Date   HGBA1C 8.4 (A) 05/09/2024   HGBA1C 8.4 (A) 01/08/2024   HGBA1C 8.2 (A) 08/10/2023     Medications: Outpatient Medications Prior to Visit  Medication Sig   Dulaglutide  (TRULICITY ) 4.5 MG/0.5ML SOAJ Inject 4.5 mg as directed once a week.   glipiZIDE  (GLUCOTROL ) 10 MG tablet TAKE 1 TABLET DAILY BEFORE BREAKFAST   metFORMIN  (GLUCOPHAGE -XR) 500 MG 24 hr tablet TAKE 2 TABLETS TWICE A DAY (NEEDS TO SCHEDULE OFFICE VISIT FOR FOLLOW UP)   pravastatin  (PRAVACHOL ) 40 MG tablet Take 1 tablet (40 mg total) by mouth every evening.   sildenafil  (REVATIO ) 20 MG tablet Take 3-5 tablets 1 hour prior to intercourse as needed   No facility-administered medications prior to visit.         Objective    BP 123/80   Pulse 66   Ht 6' (1.829 m)   Wt 256 lb (116.1 kg)   SpO2 99%   BMI 34.72 kg/m    Physical Exam   General appearance: Mildly obese male, cooperative and in no acute distress Head: Normocephalic, without obvious abnormality, atraumatic Respiratory: Respirations even and unlabored, normal respiratory rate Extremities: All extremities are intact.  Skin: Skin color, texture, turgor normal. No rashes seen  Psych: Appropriate mood and affect. Neurologic: Mental status: Alert, oriented to person, place, and time, thought content appropriate.   Results for orders  placed or performed in visit on 05/09/24  POCT glycosylated hemoglobin (Hb A1C)  Result Value Ref Range   Hemoglobin A1C 8.4 (A) 4.0 - 5.6 %    Assessment & Plan     1. Diabetes mellitus with nephropathy (HCC) (Primary) A1c not controlled.  Will change Trulicity  to Mounjaro 7.5mg  with next refill in early October.  Follow up to check A1c in January. Will check uACR at that time. Consider ARB if uACR worsening at that time.   Return in about 20 weeks (around 09/26/2024) for Diabetes.         Nancyann Perry, MD  [facility] [phone] (phone) [phone] (fax)  [facility]

## 2024-05-13 ENCOUNTER — Other Ambulatory Visit: Payer: Managed Care, Other (non HMO)

## 2024-05-13 DIAGNOSIS — N401 Enlarged prostate with lower urinary tract symptoms: Secondary | ICD-10-CM

## 2024-05-14 LAB — PSA: Prostate Specific Ag, Serum: 7.8 ng/mL — ABNORMAL HIGH (ref 0.0–4.0)

## 2024-06-11 ENCOUNTER — Other Ambulatory Visit: Payer: Self-pay | Admitting: Family Medicine

## 2024-06-18 ENCOUNTER — Other Ambulatory Visit: Payer: Self-pay | Admitting: Family Medicine

## 2024-06-18 DIAGNOSIS — E1121 Type 2 diabetes mellitus with diabetic nephropathy: Secondary | ICD-10-CM

## 2024-06-18 MED ORDER — TIRZEPATIDE 7.5 MG/0.5ML ~~LOC~~ SOAJ
7.5000 mg | SUBCUTANEOUS | 1 refills | Status: AC
Start: 2024-06-18 — End: ?

## 2024-06-23 ENCOUNTER — Other Ambulatory Visit: Payer: Self-pay | Admitting: Family Medicine

## 2024-09-04 ENCOUNTER — Other Ambulatory Visit: Payer: Self-pay | Admitting: Family Medicine

## 2024-10-10 ENCOUNTER — Ambulatory Visit: Admitting: Family Medicine

## 2024-10-10 ENCOUNTER — Encounter: Payer: Self-pay | Admitting: Family Medicine

## 2024-10-10 VITALS — BP 115/78 | HR 71 | Resp 16 | Ht 72.0 in | Wt 245.0 lb

## 2024-10-10 DIAGNOSIS — Z23 Encounter for immunization: Secondary | ICD-10-CM

## 2024-10-10 DIAGNOSIS — E785 Hyperlipidemia, unspecified: Secondary | ICD-10-CM

## 2024-10-10 DIAGNOSIS — Z7984 Long term (current) use of oral hypoglycemic drugs: Secondary | ICD-10-CM

## 2024-10-10 DIAGNOSIS — E1121 Type 2 diabetes mellitus with diabetic nephropathy: Secondary | ICD-10-CM | POA: Diagnosis not present

## 2024-10-10 DIAGNOSIS — Z7985 Long-term (current) use of injectable non-insulin antidiabetic drugs: Secondary | ICD-10-CM | POA: Diagnosis not present

## 2024-10-10 NOTE — Progress Notes (Signed)
 "     Established patient visit   Patient: Ronald Shah   DOB: 01-Aug-1961   64 y.o. Male  MRN: [id number] Visit Date: 10/10/2024  Today's healthcare provider: Nancyann Perry, MD   Chief Complaint  Patient presents with   Medical Management of Chronic Issues    No eye exam done, will schedule this year.   Subjective    Discussed the use of AI scribe software for clinical note transcription with the patient, who gave verbal consent to proceed.  History of Present Illness   Ronald Shah is a 64 year old male with diabetes who presents for follow-up of blood sugar management.  He has experienced improved blood sugar control recently, with a reading of 118 mg/dL last night and 837 mg/dL this morning. He denies any episodes of hypoglycemia.  He notes a change in appetite, feeling less hungry and attributing this to the medication Mounjaro . He denies any gastrointestinal side effects such as stomach upset, constipation, or bloating.  His current medications for diabetes include glipizide , metformin , and Mounjaro , along with medication for cholesterol management. He has a sufficient supply of Mounjaro , with ten more shots available, and receives his medication through [facility].  No chest pain, heart flutters, or shortness of breath.     Lab Results  Component Value Date   HGBA1C 8.4 (A) 05/09/2024   HGBA1C 8.4 (A) 01/08/2024   HGBA1C 8.2 (A) 08/10/2023   Wt Readings from Last 3 Encounters:  10/10/24 245 lb (111.1 kg)  05/09/24 256 lb (116.1 kg)  01/08/24 253 lb 11.2 oz (115.1 kg)   Lab Results  Component Value Date   CHOL 127 08/10/2023   HDL 51 08/10/2023   LDLCALC 58 08/10/2023   LDLDIRECT 72 03/26/2018   TRIG 95 08/10/2023   CHOLHDL 2.8 07/27/2022     Medications: Show/hide medication list[1] Review of Systems  Constitutional:  Negative for appetite change, chills and fever.  Respiratory:  Negative for chest tightness, shortness of breath and wheezing.    Cardiovascular:  Negative for chest pain and palpitations.  Gastrointestinal:  Negative for abdominal pain, nausea and vomiting.       Objective    BP 115/78   Pulse 71   Resp 16   Ht 6' (1.829 m)   Wt 245 lb (111.1 kg)   BMI 33.23 kg/m   Physical Exam   General appearance: Mildly obese male, cooperative and in no acute distress Head: Normocephalic, without obvious abnormality, atraumatic Respiratory: Respirations even and unlabored, normal respiratory rate Extremities: All extremities are intact.  Skin: Skin color, texture, turgor normal. No rashes seen  Psych: Appropriate mood and affect. Neurologic: Mental status: Alert, oriented to person, place, and time, thought content appropriate.    Assessment & Plan    1. Diabetes mellitus with nephropathy (HCC) (Primary) Doing very well with current medications.   - Urine microalbumin-creatinine with uACR for nephropathy  - Comprehensive metabolic panel with GFR - Hemoglobin A1c Consider reducing glipizide  and increasing Mounjaro  which he is tolerating with no adverse effects at 7.5mg   2. Hyperlipidemia, unspecified hyperlipidemia type He is tolerating pravastatin  well with no adverse effects.   - Lipid panel       Nancyann Perry, MD  [facility] [phone] (phone) [phone] (fax)  [facility]    [1]  Outpatient Medications Prior to Visit  Medication Sig   glipiZIDE  (GLUCOTROL ) 10 MG tablet TAKE 1 TABLET DAILY BEFORE BREAKFAST   metFORMIN  (GLUCOPHAGE -XR)  500 MG 24 hr tablet TAKE 2 TABLETS TWICE A DAY (NEEDS TO SCHEDULE OFFICE VISIT FOR FOLLOW UP)   pravastatin  (PRAVACHOL ) 40 MG tablet TAKE 1 TABLET EVERY EVENING   sildenafil  (REVATIO ) 20 MG tablet Take 3-5 tablets 1 hour prior to intercourse as needed   tirzepatide  (MOUNJARO ) 7.5 MG/0.5ML Pen Inject 7.5 mg into the skin once a week. Take IN PLACE of Trulicity    No facility-administered medications prior to visit.    "

## 2024-10-10 NOTE — Patient Instructions (Signed)
.   Please review the attached list of medications and notify my office if there are any errors.   . Please contact your eyecare professional to schedule a routine eye exam  

## 2024-10-11 LAB — MICROALBUMIN / CREATININE URINE RATIO
Creatinine, Urine: 228.7 mg/dL
Microalb/Creat Ratio: 27 mg/g{creat} (ref 0–29)
Microalbumin, Urine: 62.6 ug/mL

## 2024-10-11 LAB — COMPREHENSIVE METABOLIC PANEL WITH GFR
ALT: 37 [IU]/L (ref 0–44)
AST: 21 [IU]/L (ref 0–40)
Albumin: 4.2 g/dL (ref 3.9–4.9)
Alkaline Phosphatase: 64 [IU]/L (ref 47–123)
BUN/Creatinine Ratio: 16 (ref 10–24)
BUN: 20 mg/dL (ref 8–27)
Bilirubin Total: 0.6 mg/dL (ref 0.0–1.2)
CO2: 22 mmol/L (ref 20–29)
Calcium: 9.6 mg/dL (ref 8.6–10.2)
Chloride: 104 mmol/L (ref 96–106)
Creatinine, Ser: 1.29 mg/dL — ABNORMAL HIGH (ref 0.76–1.27)
Globulin, Total: 2.8 g/dL (ref 1.5–4.5)
Glucose: 113 mg/dL — ABNORMAL HIGH (ref 70–99)
Potassium: 4.7 mmol/L (ref 3.5–5.2)
Sodium: 139 mmol/L (ref 134–144)
Total Protein: 7 g/dL (ref 6.0–8.5)
eGFR: 62 mL/min/{1.73_m2}

## 2024-10-11 LAB — HEMOGLOBIN A1C
Est. average glucose Bld gHb Est-mCnc: 189 mg/dL
Hgb A1c MFr Bld: 8.2 % — ABNORMAL HIGH (ref 4.8–5.6)

## 2024-10-11 LAB — LIPID PANEL
Chol/HDL Ratio: 2.7 ratio (ref 0.0–5.0)
Cholesterol, Total: 127 mg/dL (ref 100–199)
HDL: 47 mg/dL
LDL Chol Calc (NIH): 62 mg/dL (ref 0–99)
Triglycerides: 94 mg/dL (ref 0–149)
VLDL Cholesterol Cal: 18 mg/dL (ref 5–40)

## 2024-10-13 ENCOUNTER — Ambulatory Visit: Payer: Self-pay | Admitting: Family Medicine

## 2024-11-06 ENCOUNTER — Other Ambulatory Visit: Payer: Managed Care, Other (non HMO)

## 2024-11-12 ENCOUNTER — Ambulatory Visit: Payer: Managed Care, Other (non HMO) | Admitting: Urology

## 2024-11-19 ENCOUNTER — Ambulatory Visit: Admitting: Urology
